# Patient Record
Sex: Male | Born: 1935 | Race: White | Hispanic: No | Marital: Married | Smoking: Never smoker
Health system: Southern US, Community
[De-identification: ages and names within clinical notes are randomized; demographics above are authoritative.]

## PROBLEM LIST (undated history)

## (undated) DIAGNOSIS — F0390 Unspecified dementia without behavioral disturbance: Secondary | ICD-10-CM

## (undated) DIAGNOSIS — F0280 Dementia in other diseases classified elsewhere without behavioral disturbance: Secondary | ICD-10-CM

## (undated) DIAGNOSIS — I10 Essential (primary) hypertension: Secondary | ICD-10-CM

## (undated) DIAGNOSIS — G309 Alzheimer's disease, unspecified: Secondary | ICD-10-CM

---

## 2013-11-28 ENCOUNTER — Emergency Department (HOSPITAL_COMMUNITY): Payer: Medicare Other

## 2013-11-28 ENCOUNTER — Inpatient Hospital Stay (HOSPITAL_COMMUNITY)
Admission: EM | Admit: 2013-11-28 | Discharge: 2013-12-01 | DRG: 482 | Disposition: A | Discharge status: Skilled Nursing Facility | Payer: Medicare Other | Attending: Internal Medicine | Admitting: Internal Medicine

## 2013-11-28 ENCOUNTER — Encounter (HOSPITAL_COMMUNITY): Payer: Self-pay | Admitting: Emergency Medicine

## 2013-11-28 DIAGNOSIS — E785 Hyperlipidemia, unspecified: Secondary | ICD-10-CM | POA: Diagnosis present

## 2013-11-28 DIAGNOSIS — S72142A Displaced intertrochanteric fracture of left femur, initial encounter for closed fracture: Secondary | ICD-10-CM

## 2013-11-28 DIAGNOSIS — F32A Depression, unspecified: Secondary | ICD-10-CM | POA: Diagnosis present

## 2013-11-28 DIAGNOSIS — R9431 Abnormal electrocardiogram [ECG] [EKG]: Secondary | ICD-10-CM | POA: Diagnosis present

## 2013-11-28 DIAGNOSIS — S72143A Displaced intertrochanteric fracture of unspecified femur, initial encounter for closed fracture: Principal | ICD-10-CM | POA: Diagnosis present

## 2013-11-28 DIAGNOSIS — F3289 Other specified depressive episodes: Secondary | ICD-10-CM | POA: Diagnosis present

## 2013-11-28 DIAGNOSIS — Z66 Do not resuscitate: Secondary | ICD-10-CM | POA: Diagnosis not present

## 2013-11-28 DIAGNOSIS — F411 Generalized anxiety disorder: Secondary | ICD-10-CM | POA: Diagnosis present

## 2013-11-28 DIAGNOSIS — Z79899 Other long term (current) drug therapy: Secondary | ICD-10-CM

## 2013-11-28 DIAGNOSIS — Z7982 Long term (current) use of aspirin: Secondary | ICD-10-CM

## 2013-11-28 DIAGNOSIS — S72002A Fracture of unspecified part of neck of left femur, initial encounter for closed fracture: Secondary | ICD-10-CM | POA: Diagnosis present

## 2013-11-28 DIAGNOSIS — W19XXXA Unspecified fall, initial encounter: Secondary | ICD-10-CM | POA: Diagnosis present

## 2013-11-28 DIAGNOSIS — I10 Essential (primary) hypertension: Secondary | ICD-10-CM | POA: Diagnosis present

## 2013-11-28 DIAGNOSIS — F0280 Dementia in other diseases classified elsewhere without behavioral disturbance: Secondary | ICD-10-CM | POA: Diagnosis present

## 2013-11-28 DIAGNOSIS — G309 Alzheimer's disease, unspecified: Secondary | ICD-10-CM | POA: Diagnosis present

## 2013-11-28 DIAGNOSIS — Y9210 Unspecified residential institution as the place of occurrence of the external cause: Secondary | ICD-10-CM | POA: Diagnosis present

## 2013-11-28 DIAGNOSIS — F329 Major depressive disorder, single episode, unspecified: Secondary | ICD-10-CM

## 2013-11-28 DIAGNOSIS — S72009A Fracture of unspecified part of neck of unspecified femur, initial encounter for closed fracture: Secondary | ICD-10-CM

## 2013-11-28 DIAGNOSIS — N400 Enlarged prostate without lower urinary tract symptoms: Secondary | ICD-10-CM | POA: Diagnosis present

## 2013-11-28 DIAGNOSIS — F419 Anxiety disorder, unspecified: Secondary | ICD-10-CM | POA: Diagnosis present

## 2013-11-28 HISTORY — DX: Unspecified dementia, unspecified severity, without behavioral disturbance, psychotic disturbance, mood disturbance, and anxiety: F03.90

## 2013-11-28 HISTORY — DX: Essential (primary) hypertension: I10

## 2013-11-28 HISTORY — DX: Alzheimer's disease, unspecified: G30.9

## 2013-11-28 HISTORY — DX: Dementia in other diseases classified elsewhere, unspecified severity, without behavioral disturbance, psychotic disturbance, mood disturbance, and anxiety: F02.80

## 2013-11-28 LAB — CBC WITH DIFFERENTIAL/PLATELET
BASOS ABS: 0 10*3/uL (ref 0.0–0.1)
BASOS PCT: 0 % (ref 0–1)
EOS ABS: 0 10*3/uL (ref 0.0–0.7)
Eosinophils Relative: 0 % (ref 0–5)
HCT: 35.5 % — ABNORMAL LOW (ref 39.0–52.0)
Hemoglobin: 12.1 g/dL — ABNORMAL LOW (ref 13.0–17.0)
Lymphocytes Relative: 8 % — ABNORMAL LOW (ref 12–46)
Lymphs Abs: 0.6 10*3/uL — ABNORMAL LOW (ref 0.7–4.0)
MCH: 31.2 pg (ref 26.0–34.0)
MCHC: 34.1 g/dL (ref 30.0–36.0)
MCV: 91.5 fL (ref 78.0–100.0)
Monocytes Absolute: 0.7 10*3/uL (ref 0.1–1.0)
Monocytes Relative: 9 % (ref 3–12)
NEUTROS PCT: 83 % — AB (ref 43–77)
Neutro Abs: 6.1 10*3/uL (ref 1.7–7.7)
Platelets: 178 10*3/uL (ref 150–400)
RBC: 3.88 MIL/uL — ABNORMAL LOW (ref 4.22–5.81)
RDW: 12.5 % (ref 11.5–15.5)
WBC: 7.4 10*3/uL (ref 4.0–10.5)

## 2013-11-28 LAB — URINALYSIS W MICROSCOPIC + REFLEX CULTURE
Bilirubin Urine: NEGATIVE
Glucose, UA: NEGATIVE mg/dL
Hgb urine dipstick: NEGATIVE
KETONES UR: NEGATIVE mg/dL
LEUKOCYTES UA: NEGATIVE
NITRITE: NEGATIVE
PH: 8 (ref 5.0–8.0)
PROTEIN: NEGATIVE mg/dL
Specific Gravity, Urine: 1.022 (ref 1.005–1.030)
Urobilinogen, UA: 1 mg/dL (ref 0.0–1.0)

## 2013-11-28 LAB — BASIC METABOLIC PANEL
BUN: 10 mg/dL (ref 6–23)
CHLORIDE: 102 meq/L (ref 96–112)
CO2: 26 mEq/L (ref 19–32)
CREATININE: 0.68 mg/dL (ref 0.50–1.35)
Calcium: 8.6 mg/dL (ref 8.4–10.5)
Glucose, Bld: 139 mg/dL — ABNORMAL HIGH (ref 70–99)
POTASSIUM: 3.7 meq/L (ref 3.7–5.3)
Sodium: 138 mEq/L (ref 137–147)

## 2013-11-28 MED ORDER — LACTATED RINGERS IV SOLN: INTRAVENOUS | Status: DC

## 2013-11-28 MED ORDER — ACETAMINOPHEN 325 MG PO TABS
650.0000 mg | ORAL_TABLET | Freq: Four times a day (QID) | ORAL | Status: DC | PRN
Start: 2013-11-28 — End: 2013-11-29

## 2013-11-28 MED ORDER — HYDROMORPHONE HCL PF 1 MG/ML IJ SOLN
0.5000 mg | Freq: Once | INTRAMUSCULAR | Status: AC
  Administered 2013-11-28: 0.5 mg via INTRAMUSCULAR

## 2013-11-28 MED ORDER — ACETAMINOPHEN 650 MG RE SUPP
650.0000 mg | Freq: Four times a day (QID) | RECTAL | Status: DC | PRN
Start: 2013-11-28 — End: 2013-11-29

## 2013-11-28 MED ORDER — SODIUM CHLORIDE 0.9 % IV SOLN: INTRAVENOUS | Status: DC

## 2013-11-28 MED ORDER — LORAZEPAM 2 MG/ML IJ SOLN
0.5000 mg | Freq: Once | INTRAMUSCULAR | Status: AC
  Administered 2013-11-28: 0.5 mg via INTRAMUSCULAR
  Filled 2013-11-28: qty 1

## 2013-11-28 MED ORDER — ONDANSETRON HCL 4 MG/2ML IJ SOLN
4.0000 mg | Freq: Four times a day (QID) | INTRAMUSCULAR | Status: DC | PRN
Start: 2013-11-28 — End: 2013-11-29

## 2013-11-28 MED ORDER — CITALOPRAM HYDROBROMIDE 10 MG PO TABS
10.0000 mg | ORAL_TABLET | Freq: Every day | ORAL | Status: DC
  Administered 2013-11-29 – 2013-12-01 (×3): 10 mg via ORAL
  Filled 2013-11-28 (×3): qty 1

## 2013-11-28 MED ORDER — DEXTROMETHORPHAN-QUINIDINE 20-10 MG PO CAPS: 1.0000 | ORAL_CAPSULE | Freq: Two times a day (BID) | ORAL | Status: DC

## 2013-11-28 MED ORDER — MORPHINE SULFATE 2 MG/ML IJ SOLN: 1.0000 mg | INTRAMUSCULAR | Status: DC | PRN

## 2013-11-28 MED ORDER — QUETIAPINE FUMARATE 25 MG PO TABS
25.0000 mg | ORAL_TABLET | Freq: Every day | ORAL | Status: DC
  Administered 2013-11-29 – 2013-12-01 (×3): 25 mg via ORAL
  Filled 2013-11-28 (×3): qty 1

## 2013-11-28 MED ORDER — SIMVASTATIN 10 MG PO TABS
10.0000 mg | ORAL_TABLET | Freq: Every day | ORAL | Status: DC
  Administered 2013-11-29 – 2013-11-30 (×2): 10 mg via ORAL
  Filled 2013-11-28 (×3): qty 1

## 2013-11-28 MED ORDER — ACETAMINOPHEN 325 MG PO TABS
650.0000 mg | ORAL_TABLET | Freq: Once | ORAL | Status: DC
  Filled 2013-11-28: qty 2

## 2013-11-28 MED ORDER — ONDANSETRON HCL 4 MG PO TABS: 4.0000 mg | ORAL_TABLET | Freq: Four times a day (QID) | ORAL | Status: DC | PRN

## 2013-11-28 MED ORDER — DOCUSATE SODIUM 100 MG PO CAPS
100.0000 mg | ORAL_CAPSULE | Freq: Two times a day (BID) | ORAL | Status: DC
  Administered 2013-11-29 – 2013-11-30 (×3): 100 mg via ORAL
  Filled 2013-11-28 (×6): qty 1

## 2013-11-28 MED ORDER — FINASTERIDE 5 MG PO TABS
5.0000 mg | ORAL_TABLET | Freq: Every day | ORAL | Status: DC
  Administered 2013-11-29 – 2013-12-01 (×3): 5 mg via ORAL
  Filled 2013-11-28 (×3): qty 1

## 2013-11-28 MED ORDER — PANTOPRAZOLE SODIUM 40 MG PO TBEC
40.0000 mg | DELAYED_RELEASE_TABLET | Freq: Every day | ORAL | Status: DC
  Administered 2013-11-29 – 2013-12-01 (×3): 40 mg via ORAL
  Filled 2013-11-28 (×3): qty 1

## 2013-11-28 MED ORDER — DOXAZOSIN MESYLATE 4 MG PO TABS
4.0000 mg | ORAL_TABLET | Freq: Every day | ORAL | Status: DC
  Administered 2013-11-29 – 2013-11-30 (×2): 4 mg via ORAL
  Filled 2013-11-28 (×3): qty 1

## 2013-11-28 MED ORDER — SENNA 8.6 MG PO TABS
1.0000 | ORAL_TABLET | Freq: Two times a day (BID) | ORAL | Status: DC
  Administered 2013-11-29 – 2013-12-01 (×5): 8.6 mg via ORAL
  Filled 2013-11-28 (×5): qty 1

## 2013-11-28 MED ORDER — QUETIAPINE FUMARATE 50 MG PO TABS
50.0000 mg | ORAL_TABLET | Freq: Every day | ORAL | Status: DC
  Administered 2013-11-29 – 2013-11-30 (×2): 50 mg via ORAL
  Filled 2013-11-28 (×3): qty 1

## 2013-11-28 MED ORDER — HYDROMORPHONE HCL PF 1 MG/ML IJ SOLN
0.5000 mg | Freq: Once | INTRAMUSCULAR | Status: DC
  Filled 2013-11-28: qty 1

## 2013-11-28 MED ORDER — LORAZEPAM 0.5 MG PO TABS
0.5000 mg | ORAL_TABLET | Freq: Once | ORAL | Status: AC
Start: 2013-11-28 — End: 2013-11-28
  Administered 2013-11-28: 0.5 mg via ORAL
  Filled 2013-11-28: qty 1

## 2013-11-28 MED ORDER — LORAZEPAM 0.5 MG PO TABS
0.5000 mg | ORAL_TABLET | Freq: Two times a day (BID) | ORAL | Status: DC
  Administered 2013-11-29 – 2013-11-30 (×3): 0.5 mg via ORAL
  Filled 2013-11-28 (×3): qty 1

## 2013-11-28 NOTE — Progress Notes (Signed)
   CARE MANAGEMENT ED NOTE 11/28/2013  Patient:  [name] [name],[name] [name]   Account Number:  [id number]  Date Initiated:  11/28/2013  Documentation initiated by:  Radford PaxFERRERO,Melven Stockard  Subjective/Objective Assessment:   Patient presents to Ed post unwitnessed fall, unwilling to bear weight to left lower extrmity.     Subjective/Objective Assessment Detail:   Patient with pmhx of alzheimer's anf HTN.     Action/Plan:   Action/Plan Detail:   Anticipated DC Date:       Status Recommendation to Physician:   Result of Recommendation:    Other ED Services  Consult Working Plan    DC Planning Services  OP Neuro Rehab  PCP issues    Choice offered to / List presented to:            Status of service:  Completed, signed off  ED Comments:   ED Comments Detail:  EDCM spoke to patient and his wife at bedside. Patient is a new resident at the [facility] [place].  Patient's wife reports patient's pcp is the physician at the nursing facility.  EDCM spoke to med tech Pansy at [facility] who reports the patient's pcp may be Dr. Sonia BallerMazzochi or Dr. Sherri Radrip. System updated.

## 2013-11-28 NOTE — ED Notes (Signed)
Pts agitation is increasing again. Wife has asked if we could medicate the patient before starting IV. Dr Romeo AppleHarrison to put in orders.

## 2013-11-28 NOTE — ED Notes (Signed)
Just spoke w Dr. August Saucerean who asked that we keep pt in the ED as pt will be going to surgery sooner than expected. Spoke w/ Talmadge Chadonna Alcorta pt's wife to obtain consent via telephone.  Mrs. Chaudoin was able to verbalize procedure and give consent to this RN as well as Tobi BastosAnna, Charity fundraiserN.

## 2013-11-28 NOTE — H&P (Signed)
[facility] History and Physical  [name] [name] ZOX:[id number] DOB: 1935-10-14 DOA: 11/28/2013  Referring physician:  PCP: Florentina Jenny, MD  Specialists:   Chief Complaint: Left hip pain  HPI: [name] [name] is a 79 y.o. WM PMHx depression, dementia, Alzheimer disease, anxiety, HLD, BPH Presented from his nursing facility with an unwitnessed fall which occurred earlier today. The patient was found down. The facility he notes that he had some bruising on his left forearm and was unable to put weight on his left leg. The wife is at the bedside and states that the patient has otherwise been well and is at baseline. They recently moved to the area from [place]. He is afebrile, mildly hypertensive, vital signs otherwise unremarkable. He has a history of dementia and is mildly agitated on exam which the wife states is normal. Will give Ativan and get screening imaging. The patient is difficult to examine due to agitation and confused responses to questioning. Will give tylenol, ativan, and get screening imaging. Per Dr. Purvis Sheffield (ED) have spoken with Dr. Dorene Grebe (orthopedic surgeon) who plans on taking patient to surgery tonight.   Review of Systems: The patient denies anorexia, fever, weight loss,, vision loss, decreased hearing, hoarseness, chest pain, syncope, dyspnea on exertion, peripheral edema, balance deficits, hemoptysis, abdominal pain, melena, hematochezia, severe indigestion/heartburn, hematuria, incontinence, genital sores, muscle weakness, suspicious skin lesions, transient blindness, difficulty walking, depression, unusual weight change, abnormal bleeding, enlarged lymph nodes, angioedema, and breast masses.    TRAVEL HISTORY:   Procedure DG hip left 11/28/2013 Comminuted left intertrochanteric fracture with varus angulation.  There is also posterior angulation and 1/2 shaft width of anterior  displacement of the distal fragment.  CT head without contrast/CT  C-spine without contrast 11/28/2013  CT head: Extensive atrophy with small vessel disease throughout the  centra semiovale bilaterally. There is no intracranial mass,  hemorrhage, or acute appearing infarct. There is probable cerumen in  both external auditory canals.  CT cervical spine: Multilevel osteoarthritic change. Slight  spondylolisthesis is C3-4 is felt to be due to spondylosis. No  fracture apparent.  There is a focal nodular lesion in the left lobe of the thyroid.  Nonemergent thyroid ultrasound advised to further assess.    Antibiotics   Consultation Dr. Dorene Grebe (orthopedic surgeon)    Past Medical History  Diagnosis Date  . Dementia   . Hypertension   . Alzheimer disease    History reviewed. No pertinent past surgical history. Social History:  reports that he has never smoked. He does not have any smokeless tobacco history on file. He reports that he does not drink alcohol or use illicit drugs. where does patient live--home, ALF, SNF? Lives at nursing home  Can patient participate in ADLs? No  No Known Allergies  No family history on file.   Prior to Admission medications   Medication Sig Start Date End Date Taking? Authorizing Provider  aspirin 81 MG tablet Take 81 mg by mouth daily.   Yes Historical Provider, MD  citalopram (CELEXA) 10 MG tablet Take 10 mg by mouth daily.   Yes Historical Provider, MD  Dextromethorphan-Quinidine (NUEDEXTA) 20-10 MG CAPS Take 1 capsule by mouth 2 (two) times daily.   Yes Historical Provider, MD  doxazosin (CARDURA) 4 MG tablet Take 4 mg by mouth at bedtime.   Yes Historical Provider, MD  finasteride (PROSCAR) 5 MG tablet Take 5 mg by mouth daily.   Yes Historical Provider, MD  LORazepam (ATIVAN) 0.5 MG tablet Take 0.5  mg by mouth 2 (two) times daily.   Yes Historical Provider, MD  metroNIDAZOLE (METROGEL) 0.75 % gel Apply 1 application topically 2 (two) times daily. Apply to rash on face   Yes Historical Provider, MD   omeprazole (PRILOSEC) 20 MG capsule Take 20 mg by mouth daily.   Yes Historical Provider, MD  pravastatin (PRAVACHOL) 20 MG tablet Take 20 mg by mouth at bedtime.   Yes Historical Provider, MD  QUEtiapine (SEROQUEL) 25 MG tablet Take 25 mg by mouth daily.   Yes Historical Provider, MD  QUEtiapine (SEROQUEL) 50 MG tablet Take 50 mg by mouth at bedtime.   Yes Historical Provider, MD   Physical Exam: Filed Vitals:   11/28/13 1430 11/28/13 1810 11/28/13 2317  BP: 162/108 140/83 127/70  Pulse: 98 98 86  Temp: 97.9 F (36.6 C)    TempSrc: Oral    Resp: 20 17 16   SpO2: 96% 100% 93%     General: A./O. x0, NAD (recently had dose of Dilaudid)  Eyes: Pupils equal round reactive to light and accommodation  Neck: Negative JVD, negative lymphadenopathy  Cardiovascular: Regular rhythm and rate, negative murmurs rubs or gallops, DP/PT pulse plus one bilateral   Respiratory: Clear to hospitalization bilateral  Abdomen: Soft, nontender, nondistended, plus bowel side  Skin: Negative lesions, rash  Musculoskeletal: Left leg rotated outward  Neurologic: Unable to assess secondary to patient's mental status  Labs on Admission:  Basic Metabolic Panel:  Recent Labs Lab 11/28/13 2055  NA 138  K 3.7  CL 102  CO2 26  GLUCOSE 139*  BUN 10  CREATININE 0.68  CALCIUM 8.6   Liver Function Tests: No results found for this basename: AST, ALT, ALKPHOS, BILITOT, PROT, ALBUMIN,  in the last 168 hours No results found for this basename: LIPASE, AMYLASE,  in the last 168 hours No results found for this basename: AMMONIA,  in the last 168 hours CBC:  Recent Labs Lab 11/28/13 2055  WBC 7.4  NEUTROABS 6.1  HGB 12.1*  HCT 35.5*  MCV 91.5  PLT 178   Cardiac Enzymes: No results found for this basename: CKTOTAL, CKMB, CKMBINDEX, TROPONINI,  in the last 168 hours  BNP (last 3 results) No results found for this basename: PROBNP,  in the last 8760 hours CBG: No results found for this  basename: GLUCAP,  in the last 168 hours  Radiological Exams on Admission: Dg Chest 2 View  11/28/2013   CLINICAL DATA:  Larey SeatFell.  History of hypertension.  EXAM: CHEST  2 VIEW  COMPARISON:  None.  FINDINGS: Poor inspiration. Mildly enlarged cardiac silhouette. Clear lungs. Thoracic spine degenerative changes. No fracture or pneumothorax seen.  IMPRESSION: No acute abnormality.   Electronically Signed   By: Gordan PaymentSteve  Reid M.D.   On: 11/28/2013 19:39   Dg Forearm Left  11/28/2013   CLINICAL DATA:  Left forearm pain following injury  EXAM: LEFT FOREARM - 2 VIEW  COMPARISON:  None.  FINDINGS: There is no evidence of fracture or other focal bone lesions. Soft tissues are unremarkable.  IMPRESSION: No acute abnormality noted.   Electronically Signed   By: Alcide CleverMark  Lukens M.D.   On: 11/28/2013 19:39   Dg Hip Complete Left  11/28/2013   CLINICAL DATA:  Left hip pain following a fall.  EXAM: LEFT HIP - COMPLETE 2+ VIEW  COMPARISON:  None.  FINDINGS: Comminuted left intertrochanteric fracture with varus angulation. There is also posterior angulation and 1/2 shaft width of anterior displacement of the distal  fragment.  IMPRESSION: Comminuted left intertrochanteric fracture, as described above.   Electronically Signed   By: Gordan Payment M.D.   On: 11/28/2013 19:40   Ct Head Wo Contrast  11/28/2013   CLINICAL DATA:  Pain post trauma  EXAM: CT HEAD WITHOUT CONTRAST  CT CERVICAL SPINE WITHOUT CONTRAST  TECHNIQUE: Multidetector CT imaging of the head and cervical spine was performed following the standard protocol without intravenous contrast. Multiplanar CT image reconstructions of the cervical spine were also generated.  COMPARISON:  None.  FINDINGS: CT HEAD FINDINGS  There is moderately severe diffuse atrophy. There is no mass, hemorrhage, extra-axial fluid collection, or midline shift. There is small vessel disease throughout the centra semiovale bilaterally. No acute appearing infarct is seen, however.  Bony calvarium appears  intact. The mastoid air cells are clear. There is debris in both external auditory canals.  CT CERVICAL SPINE FINDINGS  There is no fracture. There is slight anterolisthesis of C3 on C4, felt to be due to spondylosis. There is no other appreciable spondylolisthesis. Prevertebral soft tissues and predental space regions are normal.  There is disc space narrowing, marked, at C4-5, C5-6, and C6-7. There is moderate disc space narrowing at C3-4 and C7-T1. There is slightly milder narrowing at C2-3. There is moderate facet hypertrophy at all levels bilaterally. There is no frank disc extrusion or stenosis appreciable.  There is a nodular lesion in the left lobe of the thyroid measuring 1.1 x 0.8 cm.  IMPRESSION: CT head: Extensive atrophy with small vessel disease throughout the centra semiovale bilaterally. There is no intracranial mass, hemorrhage, or acute appearing infarct. There is probable cerumen in both external auditory canals.  CT cervical spine: Multilevel osteoarthritic change. Slight spondylolisthesis is C3-4 is felt to be due to spondylosis. No fracture apparent.  There is a focal nodular lesion in the left lobe of the thyroid. Nonemergent thyroid ultrasound advised to further assess.   Electronically Signed   By: Bretta Bang M.D.   On: 11/28/2013 15:26   Ct Cervical Spine Wo Contrast  11/28/2013   CLINICAL DATA:  Pain post trauma  EXAM: CT HEAD WITHOUT CONTRAST  CT CERVICAL SPINE WITHOUT CONTRAST  TECHNIQUE: Multidetector CT imaging of the head and cervical spine was performed following the standard protocol without intravenous contrast. Multiplanar CT image reconstructions of the cervical spine were also generated.  COMPARISON:  None.  FINDINGS: CT HEAD FINDINGS  There is moderately severe diffuse atrophy. There is no mass, hemorrhage, extra-axial fluid collection, or midline shift. There is small vessel disease throughout the centra semiovale bilaterally. No acute appearing infarct is seen,  however.  Bony calvarium appears intact. The mastoid air cells are clear. There is debris in both external auditory canals.  CT CERVICAL SPINE FINDINGS  There is no fracture. There is slight anterolisthesis of C3 on C4, felt to be due to spondylosis. There is no other appreciable spondylolisthesis. Prevertebral soft tissues and predental space regions are normal.  There is disc space narrowing, marked, at C4-5, C5-6, and C6-7. There is moderate disc space narrowing at C3-4 and C7-T1. There is slightly milder narrowing at C2-3. There is moderate facet hypertrophy at all levels bilaterally. There is no frank disc extrusion or stenosis appreciable.  There is a nodular lesion in the left lobe of the thyroid measuring 1.1 x 0.8 cm.  IMPRESSION: CT head: Extensive atrophy with small vessel disease throughout the centra semiovale bilaterally. There is no intracranial mass, hemorrhage, or acute appearing infarct. There is  probable cerumen in both external auditory canals.  CT cervical spine: Multilevel osteoarthritic change. Slight spondylolisthesis is C3-4 is felt to be due to spondylosis. No fracture apparent.  There is a focal nodular lesion in the left lobe of the thyroid. Nonemergent thyroid ultrasound advised to further assess.   Electronically Signed   By: Bretta Bang M.D.   On: 11/28/2013 15:26    EKG: NSR, nonspecific ST-T wave changes in leads III, aVF, cannot rule out ischemia. No previous EKG for comparison  Assessment/Plan Active Problems:   Closed left hip fracture   Alzheimer's dementia   Depression   Anxiety   HLD (hyperlipidemia)   BPH (benign prostatic hyperplasia)   Abnormal EKG    Comminuted left intertrochanteric fracture -Dr. Dorene Grebe (orthopedic surgeon) to take patient to the OR this evening  Anxiety -Continue Ativan  Depression -Continue Celexa -Continue Seroquel  BPH -Proscar and Cardura   HLD -Continue  HTN -See BPH  Abnormal EKG -Obtain troponin  x3 -Obtain proBNP -Obtain TSH    Code Status: Full Family Communication: No family members present Disposition Plan: Per orthopedic surgery  Time spent: 45 minutes Drema [place] [facility] Pager [phone]  If 7PM-7AM, please contact night-coverage [email or web address] Password [facility] 11/28/2013, 11:27 PM

## 2013-11-28 NOTE — Consult Note (Signed)
Reason for Consult: Left hip pain Referring Physician: dr Sherral Hammers  [name] [name] is an 79 y.o. male.  HPI: [name] [name] is a 79 year old patient who sustained an unwitnessed fall today. He reports left hip pain. Patient has dementia. He was just admitted here 2 days ago from [place] to stay with her daughter. He has reported decreased mobility and potential onset of early Parkinson's. Nonetheless the patient is somewhat level for wheelchair transfers. He denies any other orthopedic complaints. No history of DVT or pulmonary embolism  Past Medical History  Diagnosis Date  . Dementia   . Hypertension   . Alzheimer disease     History reviewed. No pertinent past surgical history.  No family history on file.  Social History:  reports that he has never smoked. He does not have any smokeless tobacco history on file. He reports that he does not drink alcohol or use illicit drugs.  Allergies: No Known Allergies  Medications: I have reviewed the patient's current medications.  Results for orders placed during the hospital encounter of 11/28/13 (from the past 48 hour(s))  URINALYSIS W MICROSCOPIC + REFLEX CULTURE     Status: Abnormal   Collection Time    11/28/13  6:44 PM      Result Value Range   Color, Urine YELLOW  YELLOW   APPearance CLOUDY (*) CLEAR   Specific Gravity, Urine 1.022  1.005 - 1.030   pH 8.0  5.0 - 8.0   Glucose, UA NEGATIVE  NEGATIVE mg/dL   Hgb urine dipstick NEGATIVE  NEGATIVE   Bilirubin Urine NEGATIVE  NEGATIVE   Ketones, ur NEGATIVE  NEGATIVE mg/dL   Protein, ur NEGATIVE  NEGATIVE mg/dL   Urobilinogen, UA 1.0  0.0 - 1.0 mg/dL   Nitrite NEGATIVE  NEGATIVE   Leukocytes, UA NEGATIVE  NEGATIVE   WBC, UA 3-6  <3 WBC/hpf   RBC / HPF 0-2  <3 RBC/hpf   Bacteria, UA FEW (*) RARE   Squamous Epithelial / LPF RARE  RARE   Urine-Other AMORPHOUS URATES/PHOSPHATES    CBC WITH DIFFERENTIAL     Status: Abnormal   Collection Time    11/28/13  8:55 PM      Result Value Range    WBC 7.4  4.0 - 10.5 K/uL   RBC 3.88 (*) 4.22 - 5.81 MIL/uL   Hemoglobin 12.1 (*) 13.0 - 17.0 g/dL   HCT 35.5 (*) 39.0 - 52.0 %   MCV 91.5  78.0 - 100.0 fL   MCH 31.2  26.0 - 34.0 pg   MCHC 34.1  30.0 - 36.0 g/dL   RDW 12.5  11.5 - 15.5 %   Platelets 178  150 - 400 K/uL   Neutrophils Relative % 83 (*) 43 - 77 %   Neutro Abs 6.1  1.7 - 7.7 K/uL   Lymphocytes Relative 8 (*) 12 - 46 %   Lymphs Abs 0.6 (*) 0.7 - 4.0 K/uL   Monocytes Relative 9  3 - 12 %   Monocytes Absolute 0.7  0.1 - 1.0 K/uL   Eosinophils Relative 0  0 - 5 %   Eosinophils Absolute 0.0  0.0 - 0.7 K/uL   Basophils Relative 0  0 - 1 %   Basophils Absolute 0.0  0.0 - 0.1 K/uL  BASIC METABOLIC PANEL     Status: Abnormal   Collection Time    11/28/13  8:55 PM      Result Value Range   Sodium 138  137 - 147 mEq/L  Potassium 3.7  3.7 - 5.3 mEq/L   Chloride 102  96 - 112 mEq/L   CO2 26  19 - 32 mEq/L   Glucose, Bld 139 (*) 70 - 99 mg/dL   BUN 10  6 - 23 mg/dL   Creatinine, Ser 0.68  0.50 - 1.35 mg/dL   Calcium 8.6  8.4 - 10.5 mg/dL   GFR calc non Af Amer >90  >90 mL/min   GFR calc Af Amer >90  >90 mL/min   Comment: (NOTE)     The eGFR has been calculated using the CKD EPI equation.     This calculation has not been validated in all clinical situations.     eGFR's persistently <90 mL/min signify possible Chronic Kidney     Disease.    Dg Chest 2 View  11/28/2013   CLINICAL DATA:  [place].  History of hypertension.  EXAM: CHEST  2 VIEW  COMPARISON:  None.  FINDINGS: Poor inspiration. Mildly enlarged cardiac silhouette. Clear lungs. Thoracic spine degenerative changes. No fracture or pneumothorax seen.  IMPRESSION: No acute abnormality.   Electronically Signed   By: Enrique Sack M.D.   On: 11/28/2013 19:39   Dg Forearm Left  11/28/2013   CLINICAL DATA:  Left forearm pain following injury  EXAM: LEFT FOREARM - 2 VIEW  COMPARISON:  None.  FINDINGS: There is no evidence of fracture or other focal bone lesions. Soft tissues are  unremarkable.  IMPRESSION: No acute abnormality noted.   Electronically Signed   By: Inez Catalina M.D.   On: 11/28/2013 19:39   Dg Hip Complete Left  11/28/2013   CLINICAL DATA:  Left hip pain following a fall.  EXAM: LEFT HIP - COMPLETE 2+ VIEW  COMPARISON:  None.  FINDINGS: Comminuted left intertrochanteric fracture with varus angulation. There is also posterior angulation and 1/2 shaft width of anterior displacement of the distal fragment.  IMPRESSION: Comminuted left intertrochanteric fracture, as described above.   Electronically Signed   By: Enrique Sack M.D.   On: 11/28/2013 19:40   Ct Head Wo Contrast  11/28/2013   CLINICAL DATA:  Pain post trauma  EXAM: CT HEAD WITHOUT CONTRAST  CT CERVICAL SPINE WITHOUT CONTRAST  TECHNIQUE: Multidetector CT imaging of the head and cervical spine was performed following the standard protocol without intravenous contrast. Multiplanar CT image reconstructions of the cervical spine were also generated.  COMPARISON:  None.  FINDINGS: CT HEAD FINDINGS  There is moderately severe diffuse atrophy. There is no mass, hemorrhage, extra-axial fluid collection, or midline shift. There is small vessel disease throughout the centra semiovale bilaterally. No acute appearing infarct is seen, however.  Bony calvarium appears intact. The mastoid air cells are clear. There is debris in both external auditory canals.  CT CERVICAL SPINE FINDINGS  There is no fracture. There is slight anterolisthesis of C3 on C4, felt to be due to spondylosis. There is no other appreciable spondylolisthesis. Prevertebral soft tissues and predental space regions are normal.  There is disc space narrowing, marked, at C4-5, C5-6, and C6-7. There is moderate disc space narrowing at C3-4 and C7-T1. There is slightly milder narrowing at C2-3. There is moderate facet hypertrophy at all levels bilaterally. There is no frank disc extrusion or stenosis appreciable.  There is a nodular lesion in the left lobe of the  thyroid measuring 1.1 x 0.8 cm.  IMPRESSION: CT head: Extensive atrophy with small vessel disease throughout the centra semiovale bilaterally. There is no intracranial mass, hemorrhage, or  acute appearing infarct. There is probable cerumen in both external auditory canals.  CT cervical spine: Multilevel osteoarthritic change. Slight spondylolisthesis is C3-4 is felt to be due to spondylosis. No fracture apparent.  There is a focal nodular lesion in the left lobe of the thyroid. Nonemergent thyroid ultrasound advised to further assess.   Electronically Signed   By: Lowella Grip M.D.   On: 11/28/2013 15:26   Ct Cervical Spine Wo Contrast  11/28/2013   CLINICAL DATA:  Pain post trauma  EXAM: CT HEAD WITHOUT CONTRAST  CT CERVICAL SPINE WITHOUT CONTRAST  TECHNIQUE: Multidetector CT imaging of the head and cervical spine was performed following the standard protocol without intravenous contrast. Multiplanar CT image reconstructions of the cervical spine were also generated.  COMPARISON:  None.  FINDINGS: CT HEAD FINDINGS  There is moderately severe diffuse atrophy. There is no mass, hemorrhage, extra-axial fluid collection, or midline shift. There is small vessel disease throughout the centra semiovale bilaterally. No acute appearing infarct is seen, however.  Bony calvarium appears intact. The mastoid air cells are clear. There is debris in both external auditory canals.  CT CERVICAL SPINE FINDINGS  There is no fracture. There is slight anterolisthesis of C3 on C4, felt to be due to spondylosis. There is no other appreciable spondylolisthesis. Prevertebral soft tissues and predental space regions are normal.  There is disc space narrowing, marked, at C4-5, C5-6, and C6-7. There is moderate disc space narrowing at C3-4 and C7-T1. There is slightly milder narrowing at C2-3. There is moderate facet hypertrophy at all levels bilaterally. There is no frank disc extrusion or stenosis appreciable.  There is a nodular  lesion in the left lobe of the thyroid measuring 1.1 x 0.8 cm.  IMPRESSION: CT head: Extensive atrophy with small vessel disease throughout the centra semiovale bilaterally. There is no intracranial mass, hemorrhage, or acute appearing infarct. There is probable cerumen in both external auditory canals.  CT cervical spine: Multilevel osteoarthritic change. Slight spondylolisthesis is C3-4 is felt to be due to spondylosis. No fracture apparent.  There is a focal nodular lesion in the left lobe of the thyroid. Nonemergent thyroid ultrasound advised to further assess.   Electronically Signed   By: Lowella Grip M.D.   On: 11/28/2013 15:26    Review of Systems  Unable to perform ROS  Blood pressure 140/83, pulse 98, temperature 97.9 F (36.6 C), temperature source Oral, resp. rate 17, SpO2 100.00%. Physical Exam  Constitutional: He appears well-developed.  HENT:  Head: Normocephalic.  Eyes: Pupils are equal, round, and reactive to light.  Neck: Normal range of motion.  Cardiovascular: Normal rate.   Respiratory: Effort normal.  GI: Soft.  Skin: Skin is warm.  Psychiatric: He has a normal mood and affect.   bilateral upper chimney demonstrate reasonable range of motion but some rigidity in the biceps area. Radial pulse intact bilaterally. Shoulder or wrist elbow range of motion is present without crepitus or swelling. Left hip has pain with range of motion no knee effusion or ankle effusion or crepitus on either side. Right hip no pain with range of motion. Patient does have observed ankle dorsiflexion bilaterally.  Assessment/Plan: Impression is left hip intertrochanteric fracture in a minimal ambulator. I discussed his care with the daughter and wife it telephone. Risk and benefits of surgical intervention were discussed as well as nonoperative intervention. In general because he does do transfers and can ambulate some fixation is indicated. Risks including not limited to infection nerve  vessel damage nonhealing nonunion need for further surgery all discussed with the patient and family all questions answered we'll proceed tonight to facilitate mobilization tomorrow.  DEAN,GREGORY SCOTT 11/28/2013, 11:17 PM

## 2013-11-28 NOTE — ED Notes (Signed)
Patient transported to X-ray 

## 2013-11-28 NOTE — ED Notes (Signed)
Per EMS-un witnessed fall, [facility]-wife noticed bruise to left upper extremity-patient not willing to put weight on left lower extremity-increased weakness

## 2013-11-28 NOTE — ED Notes (Signed)
Pt placed on O2 monitor.

## 2013-11-28 NOTE — ED Notes (Signed)
Dr. August Saucerean on phone w/ pt's daughter Baxter HireKristen to discuss surgery that is to take place 11/29/2013.

## 2013-11-28 NOTE — ED Notes (Signed)
Pt was agitated while removing clothes and calling out in pain while grabbing at left side. Pt is less agitated now after a few minutes.

## 2013-11-28 NOTE — ED Provider Notes (Signed)
CSN: [id number]     Arrival date & time 11/28/13  1400 History   First MD Initiated Contact with Patient 11/28/13 1452     Chief Complaint  Patient presents with  . Hip Pain   (Consider location/radiation/quality/duration/timing/severity/associated sxs/prior Treatment) Patient is a 79 y.o. male presenting with hip pain. The history is provided by the spouse and the EMS personnel.  Hip Pain This is a new problem. The current episode started 3 to 5 hours ago. The problem occurs constantly. The problem has not changed since onset.Pertinent negatives include no chest pain, no abdominal pain, no headaches and no shortness of breath. The symptoms are aggravated by walking. Nothing relieves the symptoms. He has tried nothing for the symptoms. The treatment provided no relief.    Past Medical History  Diagnosis Date  . Dementia   . Hypertension   . Alzheimer disease    History reviewed. No pertinent past surgical history. No family history on file. History  Substance Use Topics  . Smoking status: Never Smoker   . Smokeless tobacco: Not on file  . Alcohol Use: No    Review of Systems  Constitutional: Negative for fever.  HENT: Negative for drooling and rhinorrhea.   Eyes: Negative for pain.  Respiratory: Negative for cough and shortness of breath.   Cardiovascular: Negative for chest pain and leg swelling.  Gastrointestinal: Negative for nausea, vomiting, abdominal pain and diarrhea.  Genitourinary: Negative for dysuria and hematuria.  Musculoskeletal: Negative for gait problem and neck pain.  Skin: Negative for color change.  Neurological: Negative for numbness and headaches.  Hematological: Negative for adenopathy.  Psychiatric/Behavioral: Negative for behavioral problems.  All other systems reviewed and are negative.    Allergies  Review of patient's allergies indicates no known allergies.  Home Medications   Current Outpatient Rx  Name  Route  Sig  Dispense  Refill  .  aspirin 81 MG tablet   Oral   Take 81 mg by mouth daily.         . citalopram (CELEXA) 10 MG tablet   Oral   Take 10 mg by mouth daily.         Marland Kitchen Dextromethorphan-Quinidine (NUEDEXTA) 20-10 MG CAPS   Oral   Take 1 capsule by mouth 2 (two) times daily.         Marland Kitchen doxazosin (CARDURA) 4 MG tablet   Oral   Take 4 mg by mouth at bedtime.         . finasteride (PROSCAR) 5 MG tablet   Oral   Take 5 mg by mouth daily.         Marland Kitchen LORazepam (ATIVAN) 0.5 MG tablet   Oral   Take 0.5 mg by mouth 2 (two) times daily.         . metroNIDAZOLE (METROGEL) 0.75 % gel   Topical   Apply 1 application topically 2 (two) times daily. Apply to rash on face         . omeprazole (PRILOSEC) 20 MG capsule   Oral   Take 20 mg by mouth daily.         . pravastatin (PRAVACHOL) 20 MG tablet   Oral   Take 20 mg by mouth at bedtime.         Marland Kitchen QUEtiapine (SEROQUEL) 25 MG tablet   Oral   Take 25 mg by mouth daily.         . QUEtiapine (SEROQUEL) 50 MG tablet   Oral   Take  50 mg by mouth at bedtime.          BP 162/108  Pulse 98  Temp(Src) 97.9 F (36.6 C) (Oral)  Resp 20  SpO2 96% Physical Exam  Nursing note and vitals reviewed. Constitutional: He appears well-developed and well-nourished.  HENT:  Head: Normocephalic and atraumatic.  Right Ear: External ear normal.  Left Ear: External ear normal.  Nose: Nose normal.  Mouth/Throat: Oropharynx is clear and moist. No oropharyngeal exudate.  Tympanic membranes are normal appearing bilaterally.  Eyes: Conjunctivae and EOM are normal. Pupils are equal, round, and reactive to light.  Neck: Normal range of motion. Neck supple.  No obvious focal tenderness to palpation of the vertebra.  Cardiovascular: Normal rate, regular rhythm, normal heart sounds and intact distal pulses.  Exam reveals no gallop and no friction rub.   No murmur heard. Pulmonary/Chest: Effort normal and breath sounds normal. No respiratory distress. He has  no wheezes.  Abdominal: Soft. Bowel sounds are normal. He exhibits no distension. There is no tenderness. There is no rebound and no guarding.  Musculoskeletal: Normal range of motion. He exhibits edema (trace pitting edema in bilateral distal lower extremities.). He exhibits no tenderness.  Mild bruising to distal and lateral aspect of left forearm.  Mild tenderness to palpation of left lateral hip.  Normal passive range of motion of the right hip without pain.  Neurological: He is alert.  A/o x1. Does not know year, location.   Follows most commands. Confused verbal responses by the patient. Mild agitation and aggression.    Skin: Skin is warm and dry.  Psychiatric:  Mild confusion. Mild agitation.     ED Course  Procedures (including critical care time) Labs Review Labs Reviewed  URINALYSIS W MICROSCOPIC + REFLEX CULTURE - Abnormal; Notable for the following:    APPearance CLOUDY (*)    Bacteria, UA FEW (*)    All other components within normal limits  CBC WITH DIFFERENTIAL - Abnormal; Notable for the following:    RBC 3.88 (*)    Hemoglobin 12.1 (*)    HCT 35.5 (*)    Neutrophils Relative % 83 (*)    Lymphocytes Relative 8 (*)    Lymphs Abs 0.6 (*)    All other components within normal limits  BASIC METABOLIC PANEL - Abnormal; Notable for the following:    Glucose, Bld 139 (*)    All other components within normal limits  COMPREHENSIVE METABOLIC PANEL - Abnormal; Notable for the following:    Glucose, Bld 130 (*)    Calcium 8.2 (*)    Total Protein 5.9 (*)    Albumin 3.0 (*)    GFR calc non Af Amer 90 (*)    All other components within normal limits  CBC WITH DIFFERENTIAL - Abnormal; Notable for the following:    RBC 3.57 (*)    Hemoglobin 11.0 (*)    HCT 33.2 (*)    Platelets 148 (*)    All other components within normal limits  MRSA PCR SCREENING  URINE CULTURE  MAGNESIUM  TSH  PRO B NATRIURETIC PEPTIDE  TROPONIN I  TROPONIN I  TROPONIN I  CBC WITH  DIFFERENTIAL   Imaging Review Dg Chest 2 View  11/28/2013   CLINICAL DATA:  Larey SeatFell.  History of hypertension.  EXAM: CHEST  2 VIEW  COMPARISON:  None.  FINDINGS: Poor inspiration. Mildly enlarged cardiac silhouette. Clear lungs. Thoracic spine degenerative changes. No fracture or pneumothorax seen.  IMPRESSION: No acute abnormality.  Electronically Signed   By: Gordan Payment M.D.   On: 11/28/2013 19:39   Dg Forearm Left  11/28/2013   CLINICAL DATA:  Left forearm pain following injury  EXAM: LEFT FOREARM - 2 VIEW  COMPARISON:  None.  FINDINGS: There is no evidence of fracture or other focal bone lesions. Soft tissues are unremarkable.  IMPRESSION: No acute abnormality noted.   Electronically Signed   By: Alcide Clever M.D.   On: 11/28/2013 19:39   Dg Hip Complete Left  11/28/2013   CLINICAL DATA:  Left hip pain following a fall.  EXAM: LEFT HIP - COMPLETE 2+ VIEW  COMPARISON:  None.  FINDINGS: Comminuted left intertrochanteric fracture with varus angulation. There is also posterior angulation and 1/2 shaft width of anterior displacement of the distal fragment.  IMPRESSION: Comminuted left intertrochanteric fracture, as described above.   Electronically Signed   By: Gordan Payment M.D.   On: 11/28/2013 19:40   Dg Femur Left  11/29/2013   CLINICAL DATA:  79 year old male undergoing left proximal femur repair. Initial encounter.  EXAM: LEFT FEMUR - 2 VIEW; DG C-ARM 1-60 MIN - NRPT [facility]  COMPARISON:  Preoperative study 11/28/2013.  FLUOROSCOPY TIME:  1 minute and 4 seconds.  FINDINGS: Five intraoperative fluoroscopic views demonstrate left femur intra medullary rod placement with proximal interlocking dynamic hip screw. Near anatomic alignment about the comminuted intertrochanteric fracture.  IMPRESSION: Left proximal femur ORIF with no adverse features.   Electronically Signed   By: Augusto Gamble M.D.   On: 11/29/2013 03:36   Ct Head Wo Contrast  11/28/2013   CLINICAL DATA:  Pain post trauma  EXAM: CT HEAD WITHOUT  CONTRAST  CT CERVICAL SPINE WITHOUT CONTRAST  TECHNIQUE: Multidetector CT imaging of the head and cervical spine was performed following the standard protocol without intravenous contrast. Multiplanar CT image reconstructions of the cervical spine were also generated.  COMPARISON:  None.  FINDINGS: CT HEAD FINDINGS  There is moderately severe diffuse atrophy. There is no mass, hemorrhage, extra-axial fluid collection, or midline shift. There is small vessel disease throughout the centra semiovale bilaterally. No acute appearing infarct is seen, however.  Bony calvarium appears intact. The mastoid air cells are clear. There is debris in both external auditory canals.  CT CERVICAL SPINE FINDINGS  There is no fracture. There is slight anterolisthesis of C3 on C4, felt to be due to spondylosis. There is no other appreciable spondylolisthesis. Prevertebral soft tissues and predental space regions are normal.  There is disc space narrowing, marked, at C4-5, C5-6, and C6-7. There is moderate disc space narrowing at C3-4 and C7-T1. There is slightly milder narrowing at C2-3. There is moderate facet hypertrophy at all levels bilaterally. There is no frank disc extrusion or stenosis appreciable.  There is a nodular lesion in the left lobe of the thyroid measuring 1.1 x 0.8 cm.  IMPRESSION: CT head: Extensive atrophy with small vessel disease throughout the centra semiovale bilaterally. There is no intracranial mass, hemorrhage, or acute appearing infarct. There is probable cerumen in both external auditory canals.  CT cervical spine: Multilevel osteoarthritic change. Slight spondylolisthesis is C3-4 is felt to be due to spondylosis. No fracture apparent.  There is a focal nodular lesion in the left lobe of the thyroid. Nonemergent thyroid ultrasound advised to further assess.   Electronically Signed   By: Bretta Bang M.D.   On: 11/28/2013 15:26   Ct Cervical Spine Wo Contrast  11/28/2013   CLINICAL DATA:  Pain post  trauma  EXAM: CT HEAD WITHOUT CONTRAST  CT CERVICAL SPINE WITHOUT CONTRAST  TECHNIQUE: Multidetector CT imaging of the head and cervical spine was performed following the standard protocol without intravenous contrast. Multiplanar CT image reconstructions of the cervical spine were also generated.  COMPARISON:  None.  FINDINGS: CT HEAD FINDINGS  There is moderately severe diffuse atrophy. There is no mass, hemorrhage, extra-axial fluid collection, or midline shift. There is small vessel disease throughout the centra semiovale bilaterally. No acute appearing infarct is seen, however.  Bony calvarium appears intact. The mastoid air cells are clear. There is debris in both external auditory canals.  CT CERVICAL SPINE FINDINGS  There is no fracture. There is slight anterolisthesis of C3 on C4, felt to be due to spondylosis. There is no other appreciable spondylolisthesis. Prevertebral soft tissues and predental space regions are normal.  There is disc space narrowing, marked, at C4-5, C5-6, and C6-7. There is moderate disc space narrowing at C3-4 and C7-T1. There is slightly milder narrowing at C2-3. There is moderate facet hypertrophy at all levels bilaterally. There is no frank disc extrusion or stenosis appreciable.  There is a nodular lesion in the left lobe of the thyroid measuring 1.1 x 0.8 cm.  IMPRESSION: CT head: Extensive atrophy with small vessel disease throughout the centra semiovale bilaterally. There is no intracranial mass, hemorrhage, or acute appearing infarct. There is probable cerumen in both external auditory canals.  CT cervical spine: Multilevel osteoarthritic change. Slight spondylolisthesis is C3-4 is felt to be due to spondylosis. No fracture apparent.  There is a focal nodular lesion in the left lobe of the thyroid. Nonemergent thyroid ultrasound advised to further assess.   Electronically Signed   By: Bretta Bang M.D.   On: 11/28/2013 15:26   Dg Pelvis Portable  11/29/2013   CLINICAL  DATA:  79 year old male status post left femoral ORIF. Initial encounter.  EXAM: PORTABLE PELVIS 1-2 VIEWS  COMPARISON:  0323 hr the same day and earlier.  FINDINGS: Portable AP view at 0416 hrs. Partially visible left femur intra medullary rod with proximal interlocking dynamic hip screw. The distal aspect of the IM rod is not included. Near anatomic alignment of the left intertrochanteric fracture. Overlying postoperative changes to the soft tissues. Visible pelvis appears stable.  IMPRESSION: Left femur ORIF with no adverse features.   Electronically Signed   By: Augusto Gamble M.D.   On: 11/29/2013 04:50   Dg C-arm 1-60 Min-no Report  11/29/2013   CLINICAL DATA:  79 year old male undergoing left proximal femur repair. Initial encounter.  EXAM: LEFT FEMUR - 2 VIEW; DG C-ARM 1-60 MIN - NRPT [facility]  COMPARISON:  Preoperative study 11/28/2013.  FLUOROSCOPY TIME:  1 minute and 4 seconds.  FINDINGS: Five intraoperative fluoroscopic views demonstrate left femur intra medullary rod placement with proximal interlocking dynamic hip screw. Near anatomic alignment about the comminuted intertrochanteric fracture.  IMPRESSION: Left proximal femur ORIF with no adverse features.   Electronically Signed   By: Augusto Gamble M.D.   On: 11/29/2013 03:36      MDM   1. Closed left hip fracture   2. Abnormal EKG   3. Alzheimer's dementia   4. Anxiety   5. BPH (benign prostatic hyperplasia)   6. Depression   7. HLD (hyperlipidemia)   8. Hip fracture    3:04 PM 79 y.o. male who presents from his nursing facility with an unwitnessed fall which occurred earlier today. The patient was found down. The facility he notes  that he had some bruising on his left forearm and was unable to put weight on his left leg. The wife is at the bedside and states that the patient has otherwise been well and is at baseline. They recently moved to the area from [place]. He is afebrile, mildly hypertensive, vital signs otherwise unremarkable. He  has a history of dementia and is mildly agitated on exam which the wife states is normal. Will give Ativan and get screening imaging. The patient is difficult to examine due to agitation and confused responses to questioning. Will give tylenol, ativan, and get screening imaging.   Imaging delayed several times d/t agitation.   Found to have left hip fx. Discussed w/ on call ortho. Will admit to hospitalist.     Junius Argyle, MD 11/29/13 1213

## 2013-11-28 NOTE — ED Notes (Signed)
CT's done pt more agitated. Ativan given. And now back to xray. Pt calm at this time.

## 2013-11-29 ENCOUNTER — Inpatient Hospital Stay (HOSPITAL_COMMUNITY): Payer: Medicare Other

## 2013-11-29 ENCOUNTER — Encounter (HOSPITAL_COMMUNITY): Payer: Self-pay | Admitting: Anesthesiology

## 2013-11-29 ENCOUNTER — Encounter (HOSPITAL_COMMUNITY): Payer: Medicare Other | Admitting: Anesthesiology

## 2013-11-29 ENCOUNTER — Encounter (HOSPITAL_COMMUNITY)
Admission: EM | Disposition: A | Discharge status: Skilled Nursing Facility | Payer: Self-pay | Source: Home / Self Care | Attending: Internal Medicine

## 2013-11-29 ENCOUNTER — Inpatient Hospital Stay (HOSPITAL_COMMUNITY): Payer: Medicare Other | Admitting: Anesthesiology

## 2013-11-29 DIAGNOSIS — S72009A Fracture of unspecified part of neck of unspecified femur, initial encounter for closed fracture: Secondary | ICD-10-CM | POA: Diagnosis present

## 2013-11-29 HISTORY — PX: INTRAMEDULLARY (IM) NAIL INTERTROCHANTERIC: SHX5875

## 2013-11-29 LAB — CBC WITH DIFFERENTIAL/PLATELET
BASOS ABS: 0 10*3/uL (ref 0.0–0.1)
BASOS PCT: 0 % (ref 0–1)
Eosinophils Absolute: 0 10*3/uL (ref 0.0–0.7)
Eosinophils Relative: 0 % (ref 0–5)
HCT: 33.2 % — ABNORMAL LOW (ref 39.0–52.0)
Hemoglobin: 11 g/dL — ABNORMAL LOW (ref 13.0–17.0)
LYMPHS PCT: 16 % (ref 12–46)
Lymphs Abs: 1.2 10*3/uL (ref 0.7–4.0)
MCH: 30.8 pg (ref 26.0–34.0)
MCHC: 33.1 g/dL (ref 30.0–36.0)
MCV: 93 fL (ref 78.0–100.0)
Monocytes Absolute: 0.8 10*3/uL (ref 0.1–1.0)
Monocytes Relative: 10 % (ref 3–12)
NEUTROS ABS: 5.9 10*3/uL (ref 1.7–7.7)
Neutrophils Relative %: 74 % (ref 43–77)
Platelets: 148 10*3/uL — ABNORMAL LOW (ref 150–400)
RBC: 3.57 MIL/uL — ABNORMAL LOW (ref 4.22–5.81)
RDW: 12.7 % (ref 11.5–15.5)
WBC: 8 10*3/uL (ref 4.0–10.5)

## 2013-11-29 LAB — URINE CULTURE
COLONY COUNT: NO GROWTH
CULTURE: NO GROWTH

## 2013-11-29 LAB — PRO B NATRIURETIC PEPTIDE: PRO B NATRI PEPTIDE: 281.9 pg/mL (ref 0–450)

## 2013-11-29 LAB — COMPREHENSIVE METABOLIC PANEL
ALT: 15 U/L (ref 0–53)
AST: 23 U/L (ref 0–37)
Albumin: 3 g/dL — ABNORMAL LOW (ref 3.5–5.2)
Alkaline Phosphatase: 79 U/L (ref 39–117)
BUN: 11 mg/dL (ref 6–23)
CALCIUM: 8.2 mg/dL — AB (ref 8.4–10.5)
CO2: 24 mEq/L (ref 19–32)
Chloride: 102 mEq/L (ref 96–112)
Creatinine, Ser: 0.69 mg/dL (ref 0.50–1.35)
GFR calc non Af Amer: 90 mL/min — ABNORMAL LOW (ref >90)
GLUCOSE: 130 mg/dL — AB (ref 70–99)
POTASSIUM: 4.2 meq/L (ref 3.7–5.3)
Sodium: 138 mEq/L (ref 137–147)
TOTAL PROTEIN: 5.9 g/dL — AB (ref 6.0–8.3)
Total Bilirubin: 0.8 mg/dL (ref 0.3–1.2)

## 2013-11-29 LAB — MRSA PCR SCREENING: MRSA by PCR: NEGATIVE

## 2013-11-29 LAB — MAGNESIUM: Magnesium: 2 mg/dL (ref 1.5–2.5)

## 2013-11-29 LAB — TSH: TSH: 2.233 u[IU]/mL (ref 0.350–4.500)

## 2013-11-29 LAB — TROPONIN I
Troponin I: <0.3 ng/mL (ref <0.30)
Troponin I: <0.3 ng/mL (ref <0.30)

## 2013-11-29 SURGERY — FIXATION, FRACTURE, INTERTROCHANTERIC, WITH INTRAMEDULLARY ROD
Anesthesia: Spinal | Site: Hip | Laterality: Left

## 2013-11-29 MED ORDER — LIDOCAINE HCL (CARDIAC) 20 MG/ML IV SOLN
INTRAVENOUS | Status: AC
  Filled 2013-11-29: qty 5

## 2013-11-29 MED ORDER — POTASSIUM CHLORIDE IN NACL 20-0.9 MEQ/L-% IV SOLN
INTRAVENOUS | Status: AC
  Administered 2013-11-29 – 2013-11-30 (×4): via INTRAVENOUS
  Filled 2013-11-29 (×6): qty 1000

## 2013-11-29 MED ORDER — ONDANSETRON HCL 4 MG/2ML IJ SOLN: 4.0000 mg | Freq: Once | INTRAMUSCULAR | Status: DC | PRN

## 2013-11-29 MED ORDER — LORAZEPAM 2 MG/ML IJ SOLN
1.0000 mg | INTRAMUSCULAR | Status: DC | PRN
  Administered 2013-11-29 (×2): 1 mg via INTRAVENOUS
  Filled 2013-11-29 (×3): qty 1

## 2013-11-29 MED ORDER — PHENYLEPHRINE HCL 10 MG/ML IJ SOLN
INTRAMUSCULAR | Status: AC
  Filled 2013-11-29: qty 1

## 2013-11-29 MED ORDER — KETAMINE HCL 10 MG/ML IJ SOLN
INTRAMUSCULAR | Status: AC
  Filled 2013-11-29: qty 1

## 2013-11-29 MED ORDER — SODIUM CHLORIDE 0.9 % IV SOLN
10.0000 mg | INTRAVENOUS | Status: DC | PRN
  Administered 2013-11-29: 25 ug/min via INTRAVENOUS

## 2013-11-29 MED ORDER — PHENOL 1.4 % MT LIQD
1.0000 | OROMUCOSAL | Status: DC | PRN
  Filled 2013-11-29: qty 177

## 2013-11-29 MED ORDER — KETAMINE HCL 10 MG/ML IJ SOLN
INTRAMUSCULAR | Status: DC | PRN
  Administered 2013-11-29: 20 mg via INTRAVENOUS
  Administered 2013-11-29: 50 mg via INTRAVENOUS

## 2013-11-29 MED ORDER — CEFAZOLIN SODIUM-DEXTROSE 2-3 GM-% IV SOLR
INTRAVENOUS | Status: DC | PRN
Start: 2013-11-29 — End: 2013-11-29
  Administered 2013-11-29: 2 g via INTRAVENOUS

## 2013-11-29 MED ORDER — 0.9 % SODIUM CHLORIDE (POUR BTL) OPTIME
TOPICAL | Status: DC | PRN
  Administered 2013-11-29: 1000 mL

## 2013-11-29 MED ORDER — PROPOFOL 10 MG/ML IV BOLUS
INTRAVENOUS | Status: AC
  Filled 2013-11-29: qty 20

## 2013-11-29 MED ORDER — ACETAMINOPHEN 325 MG PO TABS: 650.0000 mg | ORAL_TABLET | Freq: Four times a day (QID) | ORAL | Status: DC | PRN

## 2013-11-29 MED ORDER — BIOTENE DRY MOUTH MT LIQD: 15.0000 mL | Freq: Two times a day (BID) | OROMUCOSAL | Status: DC

## 2013-11-29 MED ORDER — METOCLOPRAMIDE HCL 10 MG PO TABS: 5.0000 mg | ORAL_TABLET | Freq: Three times a day (TID) | ORAL | Status: DC | PRN

## 2013-11-29 MED ORDER — EPHEDRINE SULFATE 50 MG/ML IJ SOLN
INTRAMUSCULAR | Status: DC | PRN
  Administered 2013-11-29 (×2): 10 mg via INTRAVENOUS

## 2013-11-29 MED ORDER — MORPHINE SULFATE 2 MG/ML IJ SOLN
0.5000 mg | INTRAMUSCULAR | Status: DC | PRN
Start: 2013-11-29 — End: 2013-12-01

## 2013-11-29 MED ORDER — BIOTENE DRY MOUTH MT LIQD
15.0000 mL | Freq: Two times a day (BID) | OROMUCOSAL | Status: DC
  Administered 2013-11-29 – 2013-12-01 (×3): 15 mL via OROMUCOSAL

## 2013-11-29 MED ORDER — ACETAMINOPHEN 10 MG/ML IV SOLN
1000.0000 mg | Freq: Once | INTRAVENOUS | Status: AC
  Administered 2013-11-29: 1000 mg via INTRAVENOUS
  Filled 2013-11-29: qty 100

## 2013-11-29 MED ORDER — ACETAMINOPHEN 650 MG RE SUPP: 650.0000 mg | Freq: Four times a day (QID) | RECTAL | Status: DC | PRN

## 2013-11-29 MED ORDER — ASPIRIN EC 325 MG PO TBEC
325.0000 mg | DELAYED_RELEASE_TABLET | Freq: Every day | ORAL | Status: DC
  Administered 2013-11-29 – 2013-12-01 (×3): 325 mg via ORAL
  Filled 2013-11-29 (×4): qty 1

## 2013-11-29 MED ORDER — FENTANYL CITRATE 0.05 MG/ML IJ SOLN
INTRAMUSCULAR | Status: DC | PRN
  Administered 2013-11-29 (×2): 50 ug via INTRAVENOUS

## 2013-11-29 MED ORDER — LACTATED RINGERS IV SOLN
INTRAVENOUS | Status: DC | PRN
  Administered 2013-11-29 (×2): via INTRAVENOUS

## 2013-11-29 MED ORDER — SODIUM CHLORIDE 0.9 % IJ SOLN
INTRAMUSCULAR | Status: AC
  Filled 2013-11-29: qty 10

## 2013-11-29 MED ORDER — MENTHOL 3 MG MT LOZG
1.0000 | LOZENGE | OROMUCOSAL | Status: DC | PRN
  Filled 2013-11-29: qty 9

## 2013-11-29 MED ORDER — FENTANYL CITRATE 0.05 MG/ML IJ SOLN: 25.0000 ug | INTRAMUSCULAR | Status: DC | PRN

## 2013-11-29 MED ORDER — EPHEDRINE SULFATE 50 MG/ML IJ SOLN
INTRAMUSCULAR | Status: AC
  Filled 2013-11-29: qty 1

## 2013-11-29 MED ORDER — PROPOFOL INFUSION 10 MG/ML OPTIME
INTRAVENOUS | Status: DC | PRN
  Administered 2013-11-29: 140 ug/kg/min via INTRAVENOUS

## 2013-11-29 MED ORDER — CEFAZOLIN SODIUM-DEXTROSE 2-3 GM-% IV SOLR
2.0000 g | Freq: Three times a day (TID) | INTRAVENOUS | Status: AC
Start: 2013-11-29 — End: 2013-11-29
  Administered 2013-11-29 (×2): 2 g via INTRAVENOUS
  Filled 2013-11-29 (×2): qty 50

## 2013-11-29 MED ORDER — CEFAZOLIN SODIUM-DEXTROSE 2-3 GM-% IV SOLR
INTRAVENOUS | Status: AC
  Filled 2013-11-29: qty 50

## 2013-11-29 MED ORDER — METOCLOPRAMIDE HCL 5 MG/ML IJ SOLN: 5.0000 mg | Freq: Three times a day (TID) | INTRAMUSCULAR | Status: DC | PRN

## 2013-11-29 MED ORDER — ONDANSETRON HCL 4 MG PO TABS: 4.0000 mg | ORAL_TABLET | Freq: Four times a day (QID) | ORAL | Status: DC | PRN

## 2013-11-29 MED ORDER — HYDROCODONE-ACETAMINOPHEN 5-325 MG PO TABS
1.0000 | ORAL_TABLET | Freq: Four times a day (QID) | ORAL | Status: DC | PRN
  Administered 2013-11-30 – 2013-12-01 (×2): 2 via ORAL
  Filled 2013-11-29 (×2): qty 2

## 2013-11-29 MED ORDER — ONDANSETRON HCL 4 MG/2ML IJ SOLN: 4.0000 mg | Freq: Four times a day (QID) | INTRAMUSCULAR | Status: DC | PRN

## 2013-11-29 MED ORDER — MEPERIDINE HCL 50 MG/ML IJ SOLN: 6.2500 mg | INTRAMUSCULAR | Status: DC | PRN

## 2013-11-29 MED ORDER — FENTANYL CITRATE 0.05 MG/ML IJ SOLN
INTRAMUSCULAR | Status: AC
  Filled 2013-11-29: qty 2

## 2013-11-29 MED ORDER — BUPIVACAINE HCL (PF) 0.75 % IJ SOLN
INTRAMUSCULAR | Status: DC | PRN
  Administered 2013-11-29: 1.6 mL

## 2013-11-29 SURGICAL SUPPLY — 43 items
BLADE SURG 15 STRL LF DISP TIS (BLADE) ×1 IMPLANT
BLADE SURG 15 STRL SS (BLADE) ×2
BLADE SURG ROTATE 9660 (MISCELLANEOUS) IMPLANT
COVER SURGICAL LIGHT HANDLE (MISCELLANEOUS) ×3 IMPLANT
DRAPE ORTHO SPLIT 77X108 STRL (DRAPES)
DRAPE PROXIMA HALF (DRAPES) IMPLANT
DRAPE STERI IOBAN 125X83 (DRAPES) ×3 IMPLANT
DRAPE SURG ORHT 6 SPLT 77X108 (DRAPES) IMPLANT
DRSG MEPILEX BORDER 4X4 (GAUZE/BANDAGES/DRESSINGS) ×6 IMPLANT
DRSG MEPILEX BORDER 4X8 (GAUZE/BANDAGES/DRESSINGS) IMPLANT
DURAPREP 26ML APPLICATOR (WOUND CARE) ×3 IMPLANT
ELECT REM PT RETURN 9FT ADLT (ELECTROSURGICAL) ×3
ELECTRODE REM PT RTRN 9FT ADLT (ELECTROSURGICAL) ×1 IMPLANT
FACESHIELD LNG OPTICON STERILE (SAFETY) IMPLANT
GAUZE XEROFORM 5X9 LF (GAUZE/BANDAGES/DRESSINGS) ×3 IMPLANT
GLOVE BIOGEL PI IND STRL 8 (GLOVE) ×1 IMPLANT
GLOVE BIOGEL PI INDICATOR 8 (GLOVE) ×2
GLOVE SURG ORTHO 8.0 STRL STRW (GLOVE) ×3 IMPLANT
GOWN STRL REUS W/ TWL XL LVL3 (GOWN DISPOSABLE) IMPLANT
GOWN STRL REUS W/TWL LRG LVL3 (GOWN DISPOSABLE) ×3 IMPLANT
GOWN STRL REUS W/TWL XL LVL3 (GOWN DISPOSABLE)
GUIDE PIN 3.2 LONG (PIN) ×3 IMPLANT
GUIDE PIN 3.2MM (MISCELLANEOUS) ×2
GUIDE PIN ORTH 343X3.2XBRAD (MISCELLANEOUS) ×1 IMPLANT
HIP SCREW SET (Screw) ×3 IMPLANT
KIT BASIN OR (CUSTOM PROCEDURE TRAY) ×3 IMPLANT
KIT ROOM TURNOVER OR (KITS) ×3 IMPLANT
MANIFOLD NEPTUNE II (INSTRUMENTS) ×3 IMPLANT
NAIL IMHS 10X40 (Nail) ×3 IMPLANT
NS IRRIG 1000ML POUR BTL (IV SOLUTION) ×3 IMPLANT
PACK GENERAL/GYN (CUSTOM PROCEDURE TRAY) ×3 IMPLANT
PAD ARMBOARD 7.5X6 YLW CONV (MISCELLANEOUS) ×6 IMPLANT
SCREW COMPRESSION (Screw) ×3 IMPLANT
SCREW LAG 90MM (Screw) ×2 IMPLANT
SCREW LAGSTD 90X21X12.7X9 (Screw) ×1 IMPLANT
SPONGE LAP 4X18 X RAY DECT (DISPOSABLE) IMPLANT
STAPLER VISISTAT 35W (STAPLE) ×3 IMPLANT
SUT ETHILON 2 0 FS 18 (SUTURE) IMPLANT
SUT VIC AB 2-0 CTB1 (SUTURE) ×3 IMPLANT
TAPE STRIPS DRAPE STRL (GAUZE/BANDAGES/DRESSINGS) IMPLANT
TOWEL OR 17X24 6PK STRL BLUE (TOWEL DISPOSABLE) ×3 IMPLANT
TOWEL OR 17X26 10 PK STRL BLUE (TOWEL DISPOSABLE) ×3 IMPLANT
WATER STERILE IRR 1000ML POUR (IV SOLUTION) IMPLANT

## 2013-11-29 NOTE — Anesthesia Procedure Notes (Signed)
Spinal  Patient location during procedure: OR Preanesthetic Checklist Completed: patient identified, site marked, surgical consent, pre-op evaluation, timeout performed, IV checked, risks and benefits discussed and monitors and equipment checked Spinal Block Patient position: left lateral decubitus Prep: DuraPrep Patient monitoring: heart rate, cardiac monitor, continuous pulse ox and blood pressure Approach: midline Location: L3-4 Injection technique: single-shot Needle Needle type: Sprotte and Quincke  Needle gauge: 22 G Needle length: 9 cm Assessment Sensory level: T4 Additional Notes Spinal Dosage in OR  Bupivicaine ml       1.6

## 2013-11-29 NOTE — Anesthesia Postprocedure Evaluation (Signed)
  Anesthesia Post-op Note  Patient: [name] [name]  Procedure(s) Performed: Procedure(s): INTRAMEDULLARY (IM) NAIL INTERTROCHANTRIC (Left)  Patient is awake and responsive. Pain and nausea are reasonably well controlled. Vital signs are stable and clinically acceptable. Oxygen saturation is clinically acceptable. There are no apparent anesthetic complications at this time. Patient is ready for discharge.

## 2013-11-29 NOTE — Progress Notes (Signed)
PT  Note  Patient Details Name: Lovett Calenderhilip Kinn MRN: [id number] DOB: 1935/01/19   Cancelled Treatment:    Reason Eval/Treat Not Completed: Other (comment) (pt will initiate evaluation in AM/ 11/30/13 due to lateness of surgery.)   Rada HayHill, Quindell Shere Elizabeth 11/29/2013, 3:48 PM Blanchard KelchKaren Genoa Freyre PT [phone]

## 2013-11-29 NOTE — Brief Op Note (Signed)
11/28/2013 - 11/29/2013  3:16 AM  PATIENT:  [name] CalenderPhilip [name]  79 y.o. male  PRE-OPERATIVE DIAGNOSIS:  Left intertrochantic fracture  POST-OPERATIVE DIAGNOSIS:  Left intertrochantic fracture  PROCEDURE:  Procedure(s): INTRAMEDULLARY (IM) NAIL INTERTROCHANTRIC  SURGEON:  Surgeon(s): Cammy CopaGregory Scott Dean, MD  ASSISTANT: none  ANESTHESIA:   spinal  EBL: 50 ml    Total I/O In: 1000 [I.V.:1000] Out: 100 [Blood:100]  BLOOD ADMINISTERED: none  DRAINS: none   LOCAL MEDICATIONS USED:  none  SPECIMEN:  No Specimen  COUNTS:  YES  TOURNIQUET:  * No tourniquets in log *  DICTATION: .Other Dictation: Dictation Number [id number]  PLAN OF CARE: Admit to inpatient   PATIENT DISPOSITION:  PACU - hemodynamically stable

## 2013-11-29 NOTE — Progress Notes (Signed)
Informed patient's wife that the pharmacy here does not carry dextramorphone with quinide (patient takes this at the nursing home).  Requested that wife bring in medication if possible.  Wife verbally confirmed understanding.

## 2013-11-29 NOTE — Preoperative (Signed)
Beta Blockers   Reason not to administer Beta Blockers:Not Applicable 

## 2013-11-29 NOTE — Transfer of Care (Signed)
Immediate Anesthesia Transfer of Care Note  Patient: [name] CalenderPhilip [name]  Procedure(s) Performed: Procedure(s): INTRAMEDULLARY (IM) NAIL INTERTROCHANTRIC (Left)  Patient Location: PACU  Anesthesia Type:Spinal  Level of Consciousness: sedated  Airway & Oxygen Therapy: Patient Spontanous Breathing and Patient connected to face mask oxygen  Post-op Assessment: Report given to PACU RN and Post -op Vital signs reviewed and stable  Post vital signs: Reviewed and stable  Complications: No apparent anesthesia complications

## 2013-11-29 NOTE — Progress Notes (Signed)
Subjective: Pt stable - resting   Objective: Vital signs in last 24 hours: Temp:  [97.5 F (36.4 C)-98.1 F (36.7 C)] 97.9 F (36.6 C) (02/07 1036) Pulse Rate:  [57-98] 86 (02/07 1036) Resp:  [10-20] 20 (02/07 1036) BP: (106-162)/(50-108) 140/94 mmHg (02/07 1036) SpO2:  [93 %-100 %] 100 % (02/07 1036) Weight:  [78.926 kg (174 lb)-81.2 kg (179 lb 0.2 oz)] 81.2 kg (179 lb 0.2 oz) (02/07 0501)  Intake/Output from previous day: 02/06 0701 - 02/07 0700 In: 1800 [I.V.:1800] Out: 100 [Blood:100] Intake/Output this shift: Total I/O In: 240 [P.O.:240] Out: -   Exam:  No cellulitis present Compartment soft  Labs:  Recent Labs  11/28/13 2055 11/29/13 0629  HGB 12.1* 11.0*    Recent Labs  11/28/13 2055 11/29/13 0629  WBC 7.4 8.0  RBC 3.88* 3.57*  HCT 35.5* 33.2*  PLT 178 148*    Recent Labs  11/28/13 2055 11/29/13 0510  NA 138 138  K 3.7 4.2  CL 102 102  CO2 26 24  BUN 10 11  CREATININE 0.68 0.69  GLUCOSE 139* 130*  CALCIUM 8.6 8.2*   No results found for this basename: LABPT, INR,  in the last 72 hours  Assessment/Plan: Plan to mobilize today - oob to chair - pwb for transfers - placement pending   DEAN,GREGORY SCOTT 11/29/2013, 12:04 PM

## 2013-11-29 NOTE — Progress Notes (Signed)
UR completed 

## 2013-11-29 NOTE — Progress Notes (Signed)
[facility] PROGRESS NOTE  Mehmet Scally KGM:[id number] DOB: 18-Oct-1935 DOA: 11/28/2013 PCP: Florentina Jenny, MD  Assessment/Plan: Comminuted left intertrochanteric fracture  -Dr. Dorene Grebe - Followed by Ortho, s/p surgery  2/7 - PT/OT Anxiety  -Continue Ativan as needed Depression  -Continue Celexa  -Continue Seroquel  BPH  -Proscar and Cardura  HLD  -Continue  HTN  -See BPH  Abnormal EKG  -Obtain troponin x3  -Obtain proBNP  -Obtain TSH  Code Status: Full Family Communication: Pt in room (indicate person spoken with, relationship, and if by phone, the number) Disposition Plan: Pending   Consultants:  Orthopedic surgery  Procedures:  L intertrochantic fracture repair 11/29/13  HPI/Subjective: Agitated this AM. No other acute events noted.  Objective: Filed Vitals:   11/29/13 0415 11/29/13 0430 11/29/13 0501 11/29/13 0515  BP: 114/50 109/55 128/76 106/65  Pulse: 59 59 64 71  Temp:  97.6 F (36.4 C) 97.9 F (36.6 C) 98.1 F (36.7 C)  TempSrc:   Axillary Axillary  Resp: 10 16 18 18   Height:   5\' 9"  (1.753 m)   Weight:   81.2 kg (179 lb 0.2 oz)   SpO2: 100% 100% 100% 100%    Intake/Output Summary (Last 24 hours) at 11/29/13 0852 Last data filed at 11/29/13 0500  Gross per 24 hour  Intake   1800 ml  Output    100 ml  Net   1700 ml   Filed Weights   11/29/13 0040 11/29/13 0501  Weight: 78.926 kg (174 lb) 81.2 kg (179 lb 0.2 oz)    Exam:   General:  Awake, in nad  Cardiovascular: regular, s1, s2  Respiratory: normal resp effort, no wheezing  Abdomen: soft, nondistended  Musculoskeletal: perfused,no clubbing   Data Reviewed: Basic Metabolic Panel:  Recent Labs Lab 11/28/13 2055 11/29/13 0510  NA 138 138  K 3.7 4.2  CL 102 102  CO2 26 24  GLUCOSE 139* 130*  BUN 10 11  CREATININE 0.68 0.69  CALCIUM 8.6 8.2*  MG  --  2.0   Liver Function Tests:  Recent Labs Lab 11/29/13 0510  AST 23  ALT 15  ALKPHOS 79  BILITOT 0.8   PROT 5.9*  ALBUMIN 3.0*   No results found for this basename: LIPASE, AMYLASE,  in the last 168 hours No results found for this basename: AMMONIA,  in the last 168 hours CBC:  Recent Labs Lab 11/28/13 2055 11/29/13 0629  WBC 7.4 8.0  NEUTROABS 6.1 5.9  HGB 12.1* 11.0*  HCT 35.5* 33.2*  MCV 91.5 93.0  PLT 178 148*   Cardiac Enzymes:  Recent Labs Lab 11/29/13 0019 11/29/13 0510  TROPONINI <0.30 <0.30   BNP (last 3 results)  Recent Labs  11/29/13 0019  PROBNP 281.9   CBG: No results found for this basename: GLUCAP,  in the last 168 hours  No results found for this or any previous visit (from the past 240 hour(s)).   Studies: Dg Chest 2 View  11/28/2013   CLINICAL DATA:  Larey Seat.  History of hypertension.  EXAM: CHEST  2 VIEW  COMPARISON:  None.  FINDINGS: Poor inspiration. Mildly enlarged cardiac silhouette. Clear lungs. Thoracic spine degenerative changes. No fracture or pneumothorax seen.  IMPRESSION: No acute abnormality.   Electronically Signed   By: Gordan Payment M.D.   On: 11/28/2013 19:39   Dg Forearm Left  11/28/2013   CLINICAL DATA:  Left forearm pain following injury  EXAM: LEFT FOREARM - 2 VIEW  COMPARISON:  None.  FINDINGS: There is no evidence of fracture or other focal bone lesions. Soft tissues are unremarkable.  IMPRESSION: No acute abnormality noted.   Electronically Signed   By: Alcide CleverMark  Lukens M.D.   On: 11/28/2013 19:39   Dg Hip Complete Left  11/28/2013   CLINICAL DATA:  Left hip pain following a fall.  EXAM: LEFT HIP - COMPLETE 2+ VIEW  COMPARISON:  None.  FINDINGS: Comminuted left intertrochanteric fracture with varus angulation. There is also posterior angulation and 1/2 shaft width of anterior displacement of the distal fragment.  IMPRESSION: Comminuted left intertrochanteric fracture, as described above.   Electronically Signed   By: Gordan PaymentSteve  Reid M.D.   On: 11/28/2013 19:40   Dg Femur Left  11/29/2013   CLINICAL DATA:  79 year old male undergoing left  proximal femur repair. Initial encounter.  EXAM: LEFT FEMUR - 2 VIEW; DG C-ARM 1-60 MIN - NRPT [facility]  COMPARISON:  Preoperative study 11/28/2013.  FLUOROSCOPY TIME:  1 minute and 4 seconds.  FINDINGS: Five intraoperative fluoroscopic views demonstrate left femur intra medullary rod placement with proximal interlocking dynamic hip screw. Near anatomic alignment about the comminuted intertrochanteric fracture.  IMPRESSION: Left proximal femur ORIF with no adverse features.   Electronically Signed   By: Augusto GambleLee  Hall M.D.   On: 11/29/2013 03:36   Ct Head Wo Contrast  11/28/2013   CLINICAL DATA:  Pain post trauma  EXAM: CT HEAD WITHOUT CONTRAST  CT CERVICAL SPINE WITHOUT CONTRAST  TECHNIQUE: Multidetector CT imaging of the head and cervical spine was performed following the standard protocol without intravenous contrast. Multiplanar CT image reconstructions of the cervical spine were also generated.  COMPARISON:  None.  FINDINGS: CT HEAD FINDINGS  There is moderately severe diffuse atrophy. There is no mass, hemorrhage, extra-axial fluid collection, or midline shift. There is small vessel disease throughout the centra semiovale bilaterally. No acute appearing infarct is seen, however.  Bony calvarium appears intact. The mastoid air cells are clear. There is debris in both external auditory canals.  CT CERVICAL SPINE FINDINGS  There is no fracture. There is slight anterolisthesis of C3 on C4, felt to be due to spondylosis. There is no other appreciable spondylolisthesis. Prevertebral soft tissues and predental space regions are normal.  There is disc space narrowing, marked, at C4-5, C5-6, and C6-7. There is moderate disc space narrowing at C3-4 and C7-T1. There is slightly milder narrowing at C2-3. There is moderate facet hypertrophy at all levels bilaterally. There is no frank disc extrusion or stenosis appreciable.  There is a nodular lesion in the left lobe of the thyroid measuring 1.1 x 0.8 cm.  IMPRESSION: CT head:  Extensive atrophy with small vessel disease throughout the centra semiovale bilaterally. There is no intracranial mass, hemorrhage, or acute appearing infarct. There is probable cerumen in both external auditory canals.  CT cervical spine: Multilevel osteoarthritic change. Slight spondylolisthesis is C3-4 is felt to be due to spondylosis. No fracture apparent.  There is a focal nodular lesion in the left lobe of the thyroid. Nonemergent thyroid ultrasound advised to further assess.   Electronically Signed   By: Bretta BangWilliam  Woodruff M.D.   On: 11/28/2013 15:26   Ct Cervical Spine Wo Contrast  11/28/2013   CLINICAL DATA:  Pain post trauma  EXAM: CT HEAD WITHOUT CONTRAST  CT CERVICAL SPINE WITHOUT CONTRAST  TECHNIQUE: Multidetector CT imaging of the head and cervical spine was performed following the standard protocol without intravenous contrast. Multiplanar CT image reconstructions of the cervical  spine were also generated.  COMPARISON:  None.  FINDINGS: CT HEAD FINDINGS  There is moderately severe diffuse atrophy. There is no mass, hemorrhage, extra-axial fluid collection, or midline shift. There is small vessel disease throughout the centra semiovale bilaterally. No acute appearing infarct is seen, however.  Bony calvarium appears intact. The mastoid air cells are clear. There is debris in both external auditory canals.  CT CERVICAL SPINE FINDINGS  There is no fracture. There is slight anterolisthesis of C3 on C4, felt to be due to spondylosis. There is no other appreciable spondylolisthesis. Prevertebral soft tissues and predental space regions are normal.  There is disc space narrowing, marked, at C4-5, C5-6, and C6-7. There is moderate disc space narrowing at C3-4 and C7-T1. There is slightly milder narrowing at C2-3. There is moderate facet hypertrophy at all levels bilaterally. There is no frank disc extrusion or stenosis appreciable.  There is a nodular lesion in the left lobe of the thyroid measuring 1.1 x  0.8 cm.  IMPRESSION: CT head: Extensive atrophy with small vessel disease throughout the centra semiovale bilaterally. There is no intracranial mass, hemorrhage, or acute appearing infarct. There is probable cerumen in both external auditory canals.  CT cervical spine: Multilevel osteoarthritic change. Slight spondylolisthesis is C3-4 is felt to be due to spondylosis. No fracture apparent.  There is a focal nodular lesion in the left lobe of the thyroid. Nonemergent thyroid ultrasound advised to further assess.   Electronically Signed   By: Bretta Bang M.D.   On: 11/28/2013 15:26   Dg Pelvis Portable  11/29/2013   CLINICAL DATA:  79 year old male status post left femoral ORIF. Initial encounter.  EXAM: PORTABLE PELVIS 1-2 VIEWS  COMPARISON:  0323 hr the same day and earlier.  FINDINGS: Portable AP view at 0416 hrs. Partially visible left femur intra medullary rod with proximal interlocking dynamic hip screw. The distal aspect of the IM rod is not included. Near anatomic alignment of the left intertrochanteric fracture. Overlying postoperative changes to the soft tissues. Visible pelvis appears stable.  IMPRESSION: Left femur ORIF with no adverse features.   Electronically Signed   By: Augusto Gamble M.D.   On: 11/29/2013 04:50   Dg C-arm 1-60 Min-no Report  11/29/2013   CLINICAL DATA:  79 year old male undergoing left proximal femur repair. Initial encounter.  EXAM: LEFT FEMUR - 2 VIEW; DG C-ARM 1-60 MIN - NRPT [facility]  COMPARISON:  Preoperative study 11/28/2013.  FLUOROSCOPY TIME:  1 minute and 4 seconds.  FINDINGS: Five intraoperative fluoroscopic views demonstrate left femur intra medullary rod placement with proximal interlocking dynamic hip screw. Near anatomic alignment about the comminuted intertrochanteric fracture.  IMPRESSION: Left proximal femur ORIF with no adverse features.   Electronically Signed   By: Augusto Gamble M.D.   On: 11/29/2013 03:36    Scheduled Meds: . aspirin EC  325 mg Oral Q breakfast   .  ceFAZolin (ANCEF) IV  2 g Intravenous Q8H  . citalopram  10 mg Oral Daily  . Dextromethorphan-Quinidine  1 capsule Oral BID  . docusate sodium  100 mg Oral BID  . doxazosin  4 mg Oral QHS  . finasteride  5 mg Oral Daily  . LORazepam  0.5 mg Oral BID  . pantoprazole  40 mg Oral Daily  . QUEtiapine  25 mg Oral Daily  . QUEtiapine  50 mg Oral QHS  . senna  1 tablet Oral BID  . simvastatin  10 mg Oral q1800   Continuous Infusions: .  0.9 % NaCl with KCl 20 mEq / L 75 mL/hr at 11/29/13 5621    Active Problems:   Closed left hip fracture   Alzheimer's dementia   Depression   Anxiety   HLD (hyperlipidemia)   BPH (benign prostatic hyperplasia)   Abnormal EKG   Hip fracture  Time spent:  Obrian Bulson K  [facility] Pager [phone]. If 7PM-7AM, please contact night-coverage at [email or web address], password [facility] 11/29/2013, 8:52 AM  LOS: 1 day

## 2013-11-29 NOTE — Plan of Care (Signed)
Pt's wife reports pt is DNR. Orders placed.

## 2013-11-29 NOTE — Anesthesia Preprocedure Evaluation (Addendum)
Anesthesia Evaluation  Patient identified by MRN, date of birth, ID band Patient awake    Reviewed: Allergy & Precautions, H&P , Patient's Chart, lab work & pertinent test results  Airway Mallampati: II TM Distance: >3 FB Neck ROM: full    Dental no notable dental hx.    Pulmonary  breath sounds clear to auscultation  Pulmonary exam normal       Cardiovascular Exercise Tolerance: Good hypertension, On Medications Rhythm:regular Rate:Normal     Neuro/Psych PSYCHIATRIC DISORDERS    GI/Hepatic   Endo/Other    Renal/GU      Musculoskeletal   Abdominal   Peds  Hematology   Anesthesia Other Findings Wife said he could walk several miles within the last year before Dementia set in Airway appears adequate; no cooperation with exam  Reproductive/Obstetrics                          Anesthesia Physical Anesthesia Plan  ASA: II  Anesthesia Plan: Spinal   Post-op Pain Management:    Induction:   Airway Management Planned:   Additional Equipment:   Intra-op Plan:   Post-operative Plan:   Informed Consent: I have reviewed the patients History and Physical, chart, labs and discussed the procedure including the risks, benefits and alternatives for the proposed anesthesia with the patient or authorized representative who has indicated his/her understanding and acceptance.   Dental Advisory Given  Plan Discussed with: CRNA  Anesthesia Plan Comments: (Lab work confirmed with CRNA in room. Platelets okay. Discussed spinal anesthetic, and patient consents to the procedure:  included risk of possible headache,backache, failed block, allergic reaction, and nerve injury. This patient was asked if she had any questions or concerns before the procedure started. )        Anesthesia Quick Evaluation

## 2013-11-30 LAB — CBC
HCT: 31 % — ABNORMAL LOW (ref 39.0–52.0)
Hemoglobin: 10.5 g/dL — ABNORMAL LOW (ref 13.0–17.0)
MCH: 31.3 pg (ref 26.0–34.0)
MCHC: 33.9 g/dL (ref 30.0–36.0)
MCV: 92.3 fL (ref 78.0–100.0)
PLATELETS: 153 10*3/uL (ref 150–400)
RBC: 3.36 MIL/uL — AB (ref 4.22–5.81)
RDW: 12.6 % (ref 11.5–15.5)
WBC: 7.3 10*3/uL (ref 4.0–10.5)

## 2013-11-30 LAB — BASIC METABOLIC PANEL
BUN: 8 mg/dL (ref 6–23)
CHLORIDE: 101 meq/L (ref 96–112)
CO2: 26 meq/L (ref 19–32)
Calcium: 8.2 mg/dL — ABNORMAL LOW (ref 8.4–10.5)
Creatinine, Ser: 0.57 mg/dL (ref 0.50–1.35)
GFR calc Af Amer: >90 mL/min (ref >90)
GFR calc non Af Amer: >90 mL/min (ref >90)
Glucose, Bld: 127 mg/dL — ABNORMAL HIGH (ref 70–99)
Potassium: 3.9 mEq/L (ref 3.7–5.3)
SODIUM: 136 meq/L — AB (ref 137–147)

## 2013-11-30 LAB — PROTIME-INR
INR: 1.23 (ref 0.00–1.49)
Prothrombin Time: 15.2 seconds (ref 11.6–15.2)

## 2013-11-30 MED ORDER — LORAZEPAM 2 MG/ML IJ SOLN: 0.5000 mg | INTRAMUSCULAR | Status: DC | PRN

## 2013-11-30 MED ORDER — ASPIRIN EC 325 MG PO TBEC: 325.0000 mg | DELAYED_RELEASE_TABLET | Freq: Every day | ORAL | Status: AC

## 2013-11-30 MED ORDER — HYDROCODONE-ACETAMINOPHEN 5-325 MG PO TABS: 1.0000 | ORAL_TABLET | Freq: Four times a day (QID) | ORAL | Status: DC | PRN

## 2013-11-30 MED ORDER — LORAZEPAM 2 MG/ML IJ SOLN: 0.5000 mg | Freq: Four times a day (QID) | INTRAMUSCULAR | Status: DC | PRN

## 2013-11-30 NOTE — Evaluation (Signed)
Physical Therapy Evaluation Patient Details Name: [name] [name] MRN: [id number] DOB: 1935/07/07 Today's Date: 11/30/2013 Time: 4098-1191 PT Time Calculation (min): 36 min  PT Assessment / Plan / Recommendation History of Present Illness  HPI: [name] [name] is a 79 y.o. WM PMHx depression, dementia, Alzheimer disease, anxiety, HLD, BPH, who fell at facility and sustained L IT hip fx, s/p IM nail on 11/28/13(late surgery) Pt is from a skilled facility.  Clinical Impression  Pt unable to respond to directions, mumbles  At times. RN reports that wife states pt was ambulatory PTA. Today pt is  Quire rigid to roll in bed. Appears discomfort of L hip. Pt will require mechanical lift OOB until more alert and able to participate. Pt will benefit from a trial of PT to mobilize.     PT Assessment  Patient needs continued PT services    Follow Up Recommendations  SNF;Supervision/Assistance - 24 hour    Does the patient have the potential to tolerate intense rehabilitation      Barriers to Discharge        Equipment Recommendations  None recommended by PT    Recommendations for Other Services     Frequency Min 3X/week    Precautions / Restrictions Precautions Precautions: Fall Restrictions Weight Bearing Restrictions: Yes LLE Weight Bearing: Partial weight bearing LLE Partial Weight Bearing Percentage or Pounds: 25%   Pertinent Vitals/Pain Moans when rolled to L.      Mobility  Bed Mobility Overal bed mobility: Needs Assistance;+2 for physical assistance;+ 2 for safety/equipment Bed Mobility: Rolling Rolling: Total assist;+2 for physical assistance;+2 for safety/equipment General bed mobility comments: Pt very rigid in trunk and legs when rolling to each side for pericare as pt was incontinent of urine. Transfers General transfer comment: NT due to pt lethargy except when moved, then becomes mildly aggitated.    Exercises     PT Diagnosis: Difficulty walking;Acute pain  PT  Problem List: Decreased strength;Decreased range of motion;Decreased activity tolerance;Decreased mobility;Decreased cognition;Pain PT Treatment Interventions: Functional mobility training;Therapeutic activities;Therapeutic exercise;Patient/family education     PT Goals(Current goals can be found in the care plan section) Acute Rehab PT Goals Patient Stated Goal: unable to respond PT Goal Formulation: Patient unable to participate in goal setting Time For Goal Achievement: 12/14/13 Potential to Achieve Goals: Fair  Visit Information  Last PT Received On: 11/30/13 Assistance Needed: +2 PT/OT/SLP Co-Evaluation/Treatment: Yes Reason for Co-Treatment: For patient/therapist safety;Complexity of the patient's impairments (multi-system involvement) PT goals addressed during session: Mobility/safety with mobility History of Present Illness: HPI: [name] [name] is a 78 y.o. WM PMHx depression, dementia, Alzheimer disease, anxiety, HLD, BPH, who fell at facility and sustained L IT hip fx, s/p IM nail on 11/28/13(late surgery) Pt is from a skilled facility.       Prior Functioning  Home Living Family/patient expects to be discharged to:: Skilled nursing facility Communication Communication:  (at times pt mumbles)    Cognition  Cognition Arousal/Alertness: Lethargic Behavior During Therapy: Restless;Agitated (when moving LLE and rolling.) Overall Cognitive Status: No family/caregiver present to determine baseline cognitive functioning    Extremity/Trunk Assessment Upper Extremity Assessment Upper Extremity Assessment: Defer to OT evaluation Lower Extremity Assessment Lower Extremity Assessment: RLE deficits/detail;LLE deficits/detail RLE Deficits / Details: pt did flex hip to 45* LLE Deficits / Details: pt keeps L leg extended and tends to IR LLE: Unable to fully assess due to pain   Balance    End of Session PT - End of Session Activity Tolerance: Patient limited  by lethargy;Patient  limited by pain Patient left: in bed;with call bell/phone within reach;with bed alarm set Nurse Communication: Mobility status;Need for lift equipment  GP     Rada HayHill, Zakariyah Freimark Elizabeth 11/30/2013, 11:02 AM Blanchard KelchKaren Jupiter Boys PT [phone]

## 2013-11-30 NOTE — Progress Notes (Signed)
Clinical Social Work Department BRIEF PSYCHOSOCIAL ASSESSMENT 11/30/2013  Patient:  [name] [name],[name] [name]     Account Number:  [id number]     Admit date:  11/28/2013  Clinical Social Worker:  Levie Heritage  Date/Time:  11/30/2013 03:09 PM  Referred by:  Physician  Date Referred:  11/30/2013 Referred for  SNF Placement   Other Referral:   Interview type:  Other - See comment Other interview type:   Pt's wife at bedside    PSYCHOSOCIAL DATA Living Status:  FACILITY Admitted from facility:  [facility] Level of care:  Assisted Living Primary support name:  [name] Primary support relationship to patient:  SPOUSE Degree of support available:   strong    CURRENT CONCERNS Current Concerns  Post-Acute Placement   Other Concerns:    SOCIAL WORK ASSESSMENT / PLAN Met with Pt's wife to discuss d/c plans.  Pt not alert or oriented.    Pt's wife stated that Pt was at [facility] for 1 day before he fell and had to be hospitalized.  She and Pt just moved to [place] from [place] to be closer to their daughter.    Pt's wife is hopeful that Pt can go back to [facility] but did agree to [facility] as a back-up plan.    CSW thanked Pt's wife for her time.   Assessment/plan status:  Psychosocial Support/Ongoing Assessment of Needs Other assessment/ plan:   Information/referral to community resources:   SNF list  Medicare.gov website info    PATIENT'S/FAMILY'S RESPONSE TO PLAN OF CARE: Pt's wife was calm, cooperative and pleasant.    Pt's wife really doesn't want Pt to have to go to SNF but understands that it may be necessary.    Pt's wife is coping well with Pt's dementia, stating that he has had dementia for 10 years and that it's just progressively worsened.   Although she misses the beach, she is happy to be closer to her daughter so that she can help with her grandchildren.    Pt's wife thanked CSW for time and assistance.   [name],  [place] [facility] [phone]

## 2013-11-30 NOTE — Evaluation (Signed)
Occupational Therapy Evaluation Patient Details Name: [name] [name] MRN: [id number] DOB: 1934-12-25 Today's Date: 11/30/2013 Time: 0300-9233 OT Time Calculation (min): 36 min  OT Assessment / Plan / Recommendation History of present illness HPI: [name] is a 79 y.o. WM PMHx depression, dementia, Alzheimer disease, anxiety, HLD, BPH, who fell at facility and sustained L IT hip fx, s/p IM nail on 11/28/13(late surgery) Pt is from a skilled facility.   Clinical Impression   Pt present with lethargy, not following commands with some mumbling.  Pt indicated pain with movement of L LE by grimacing.  He required +2 total assist for bed mobility and total assist for all ADL.  Recommending return to SNF upon discharge.  Will defer OT to SNF.    OT Assessment  All further OT needs can be met in the next venue of care    Follow Up Recommendations  SNF    Barriers to Discharge      Equipment Recommendations       Recommendations for Other Services    Frequency       Precautions / Restrictions Precautions Precautions: Fall Restrictions Weight Bearing Restrictions: Yes LLE Weight Bearing: Partial weight bearing LLE Partial Weight Bearing Percentage or Pounds: 25%   Pertinent Vitals/Pain L LE with movement, unable to rate, repositioned, RN aware    ADL  Eating/Feeding: +1 Total assistance Where Assessed - Eating/Feeding: Bed level Grooming: Wash/dry face;+1 Total assistance Where Assessed - Grooming: Supine, head of bed up Upper Body Bathing: +1 Total assistance Where Assessed - Upper Body Bathing: Supine, head of bed up;Rolling right and/or left Lower Body Bathing: +2 Total assistance Lower Body Bathing: Patient Percentage: 0% Where Assessed - Lower Body Bathing: Supine, head of bed up;Rolling right and/or left Upper Body Dressing: +1 Total assistance Where Assessed - Upper Body Dressing: Supine, head of bed up Lower Body Dressing: +1 Total assistance Where Assessed - Lower  Body Dressing: Supine, head of bed up Toileting - Clothing Manipulation and Hygiene: +2 Total assistance Toileting - Clothing Manipulation and Hygiene: Patient Percentage: 0% Where Assessed - Toileting Clothing Manipulation and Hygiene: Supine, head of bed flat;Rolling right and/or left    OT Diagnosis: Generalized weakness;Cognitive deficits;Acute pain  OT Problem List: Decreased strength;Decreased activity tolerance;Decreased cognition;Impaired tone;Impaired UE functional use;Pain OT Treatment Interventions:     OT Goals(Current goals can be found in the care plan section) Acute Rehab OT Goals Patient Stated Goal: unable to respond  Visit Information  Last OT Received On: 11/30/13 Assistance Needed: +2 PT/OT/SLP Co-Evaluation/Treatment: Yes Reason for Co-Treatment: Complexity of the patient's impairments (multi-system involvement);For patient/therapist safety;Necessary to address cognition/behavior during functional activity PT goals addressed during session: Mobility/safety with mobility OT goals addressed during session: ADL's and self-care History of Present Illness: HPI: [name] is a 79 y.o. WM PMHx depression, dementia, Alzheimer disease, anxiety, HLD, BPH, who fell at facility and sustained L IT hip fx, s/p IM nail on 11/28/13(late surgery) Pt is from a skilled facility.       Prior [place] expects to be discharged to:: Skilled nursing facility Communication Communication: Expressive difficulties (mumbles some)         Vision/Perception     Cognition  Cognition Arousal/Alertness: Lethargic Behavior During Therapy: Restless;Agitated Overall Cognitive Status: No family/caregiver present to determine baseline cognitive functioning    Extremity/Trunk Assessment Upper Extremity Assessment Upper Extremity Assessment: RUE deficits/detail;LUE deficits/detail RUE Deficits / Details: rigidity, arthritic changes in hands LUE  Deficits /  Details: rigidity, arthritic changes in hands Lower Extremity Assessment Lower Extremity Assessment: Defer to PT evaluation RLE Deficits / Details: pt did flex hip to 45* LLE Deficits / Details: pt keeps L leg extended and tends to IR LLE: Unable to fully assess due to pain     Mobility Bed Mobility Overal bed mobility: Needs Assistance;+2 for physical assistance;+ 2 for safety/equipment Bed Mobility: Rolling Rolling: Total assist;+2 for physical assistance;+2 for safety/equipment General bed mobility comments: Pt very rigid in trunk and legs when rolling to each side for pericare as pt was incontinent of urine. Transfers General transfer comment: NT due to pt lethargy except when moved, then becomes mildly aggitated.     Exercise     Balance     End of Session OT - End of Session Activity Tolerance: Patient limited by pain;Patient limited by lethargy Patient left: in bed;with call bell/phone within reach Nurse Communication: Need for lift equipment (pt with urine soaked bed, condom cath off)  GO     Malka So 11/30/2013, 11:16 AM [phone]

## 2013-11-30 NOTE — Progress Notes (Signed)
Clinical Social Work Department CLINICAL SOCIAL WORK PLACEMENT NOTE 11/30/2013  Patient:  Behne,Eryn  Account Number:  [id number] Admit date:  11/28/2013  Clinical Social Worker:  Doroteo GlassmanAMANDA Charolette Bultman, LCSWA  Date/time:  11/30/2013 03:24 PM  Clinical Social Work is seeking post-discharge placement for this patient at the following level of care:   SKILLED NURSING   (*CSW will update this form in Epic as items are completed)   11/30/2013  Patient/family provided with [facility] of Clinical Social Work's list of facilities offering this level of care within the geographic area requested by the patient (or if unable, by the patient's family).  11/30/2013  Patient/family informed of their freedom to choose among providers that offer the needed level of care, that participate in Medicare, Medicaid or managed care program needed by the patient, have an available bed and are willing to accept the patient.  11/30/2013  Patient/family informed of [facility]' ownership interest in [facility], as well as of the fact that they are under no obligation to receive care at this facility.  PASARR submitted to EDS on 11/30/2013 PASARR number received from EDS on 11/30/2013  FL2 transmitted to all facilities in geographic area requested by pt/family on  11/30/2013 FL2 transmitted to all facilities within larger geographic area on   Patient informed that his/her managed care company has contracts with or will negotiate with  certain facilities, including the following:     Patient/family informed of bed offers received:   Patient chooses bed at  Physician recommends and patient chooses bed at    Patient to be transferred to  on   Patient to be transferred to facility by   The following physician request were entered in Epic:   Additional Comments:  Providence CrosbyAmanda Margaurite Salido, Theresia MajorsLCSWA Clinical Social Work [phone]

## 2013-11-30 NOTE — Progress Notes (Signed)
[facility] PROGRESS NOTE  [name] [name] ZOX:[id number] DOB: [birth year] DOA: 11/28/2013 PCP: Florentina Jenny, MD  Assessment/Plan: Comminuted left intertrochanteric fracture  -Dr. Dorene Grebe - Followed by Ortho, s/p surgery  2/7 - PT/OT - Stable thus far Anxiety  -Continue Ativan as needed Depression  -Continue Celexa  -Continue Seroquel  BPH  -Proscar and Cardura  HLD  -Continue  HTN  -See BPH  Abnormal EKG  -Troponin neg x3  -proBNP unremarkable -TSH normal  Code Status: DNR Family Communication: Pt in room Disposition Plan: Pending   Consultants:  Orthopedic surgery  Procedures:  L intertrochantic fracture repair 11/29/13  HPI/Subjective: No events noted overnight  Objective: Filed Vitals:   11/29/13 2148 11/29/13 2318 11/30/13 0204 11/30/13 0537  BP: 155/94  151/89 126/68  Pulse:  95 88 92  Temp:  98.9 F (37.2 C) 98.3 F (36.8 C) 98.2 F (36.8 C)  TempSrc:  Axillary Axillary Axillary  Resp:   20 20  Height:      Weight:      SpO2:  98% 99% 99%    Intake/Output Summary (Last 24 hours) at 11/30/13 0923 Last data filed at 11/30/13 0700  Gross per 24 hour  Intake   2450 ml  Output   1000 ml  Net   1450 ml   Filed Weights   11/29/13 0040 11/29/13 0501  Weight: 78.926 kg (174 lb) 81.2 kg (179 lb 0.2 oz)    Exam:   General:  Awake, in nad  Cardiovascular: regular, s1, s2  Respiratory: normal resp effort, no wheezing  Abdomen: soft, nondistended  Musculoskeletal: perfused,no clubbing   Data Reviewed: Basic Metabolic Panel:  Recent Labs Lab 11/28/13 2055 11/29/13 0510 11/30/13 0546  NA 138 138 136*  K 3.7 4.2 3.9  CL 102 102 101  CO2 26 24 26   GLUCOSE 139* 130* 127*  BUN 10 11 8   CREATININE 0.68 0.69 0.57  CALCIUM 8.6 8.2* 8.2*  MG  --  2.0  --    Liver Function Tests:  Recent Labs Lab 11/29/13 0510  AST 23  ALT 15  ALKPHOS 79  BILITOT 0.8  PROT 5.9*  ALBUMIN 3.0*   No results found for this basename:  LIPASE, AMYLASE,  in the last 168 hours No results found for this basename: AMMONIA,  in the last 168 hours CBC:  Recent Labs Lab 11/28/13 2055 11/29/13 0629 11/30/13 0546  WBC 7.4 8.0 7.3  NEUTROABS 6.1 5.9  --   HGB 12.1* 11.0* 10.5*  HCT 35.5* 33.2* 31.0*  MCV 91.5 93.0 92.3  PLT 178 148* 153   Cardiac Enzymes:  Recent Labs Lab 11/29/13 0019 11/29/13 0510 11/29/13 1120  TROPONINI <0.30 <0.30 <0.30   BNP (last 3 results)  Recent Labs  11/29/13 0019  PROBNP 281.9   CBG: No results found for this basename: GLUCAP,  in the last 168 hours  Recent Results (from the past 240 hour(s))  URINE CULTURE     Status: None   Collection Time    11/28/13  6:44 PM      Result Value Range Status   Specimen Description URINE, CLEAN CATCH   Final   Special Requests NONE   Final   Culture  Setup Time     Final   Value: 11/29/2013 00:42     Performed at [facility] Count     Final   Value: NO GROWTH     Performed at [employer]  Final   Value: NO GROWTH     Performed at [employer] [facility]   Report Status 11/29/2013 FINAL   Final  MRSA PCR SCREENING     Status: None   Collection Time    11/29/13  7:32 AM      Result Value Range Status   MRSA by PCR NEGATIVE  NEGATIVE Final   Comment:            The GeneXpert MRSA Assay (FDA     approved for NASAL specimens     only), is one component of a     comprehensive MRSA colonization     surveillance program. It is not     intended to diagnose MRSA     infection nor to guide or     monitor treatment for     MRSA infections.     Studies: Dg Chest 2 View  11/28/2013   CLINICAL DATA:  Larey Seat.  History of hypertension.  EXAM: CHEST  2 VIEW  COMPARISON:  None.  FINDINGS: Poor inspiration. Mildly enlarged cardiac silhouette. Clear lungs. Thoracic spine degenerative changes. No fracture or pneumothorax seen.  IMPRESSION: No acute abnormality.   Electronically Signed   By: Gordan Payment M.D.   On:  11/28/2013 19:39   Dg Forearm Left  11/28/2013   CLINICAL DATA:  Left forearm pain following injury  EXAM: LEFT FOREARM - 2 VIEW  COMPARISON:  None.  FINDINGS: There is no evidence of fracture or other focal bone lesions. Soft tissues are unremarkable.  IMPRESSION: No acute abnormality noted.   Electronically Signed   By: Alcide Clever M.D.   On: 11/28/2013 19:39   Dg Hip Complete Left  11/28/2013   CLINICAL DATA:  Left hip pain following a fall.  EXAM: LEFT HIP - COMPLETE 2+ VIEW  COMPARISON:  None.  FINDINGS: Comminuted left intertrochanteric fracture with varus angulation. There is also posterior angulation and 1/2 shaft width of anterior displacement of the distal fragment.  IMPRESSION: Comminuted left intertrochanteric fracture, as described above.   Electronically Signed   By: Gordan Payment M.D.   On: 11/28/2013 19:40   Dg Femur Left  11/29/2013   CLINICAL DATA:  79 year old male undergoing left proximal femur repair. Initial encounter.  EXAM: LEFT FEMUR - 2 VIEW; DG C-ARM 1-60 MIN - NRPT [facility]  COMPARISON:  Preoperative study 11/28/2013.  FLUOROSCOPY TIME:  1 minute and 4 seconds.  FINDINGS: Five intraoperative fluoroscopic views demonstrate left femur intra medullary rod placement with proximal interlocking dynamic hip screw. Near anatomic alignment about the comminuted intertrochanteric fracture.  IMPRESSION: Left proximal femur ORIF with no adverse features.   Electronically Signed   By: Augusto Gamble M.D.   On: 11/29/2013 03:36   Ct Head Wo Contrast  11/28/2013   CLINICAL DATA:  Pain post trauma  EXAM: CT HEAD WITHOUT CONTRAST  CT CERVICAL SPINE WITHOUT CONTRAST  TECHNIQUE: Multidetector CT imaging of the head and cervical spine was performed following the standard protocol without intravenous contrast. Multiplanar CT image reconstructions of the cervical spine were also generated.  COMPARISON:  None.  FINDINGS: CT HEAD FINDINGS  There is moderately severe diffuse atrophy. There is no mass, hemorrhage,  extra-axial fluid collection, or midline shift. There is small vessel disease throughout the centra semiovale bilaterally. No acute appearing infarct is seen, however.  Bony calvarium appears intact. The mastoid air cells are clear. There is debris in both external auditory canals.  CT CERVICAL SPINE FINDINGS  There is  no fracture. There is slight anterolisthesis of C3 on C4, felt to be due to spondylosis. There is no other appreciable spondylolisthesis. Prevertebral soft tissues and predental space regions are normal.  There is disc space narrowing, marked, at C4-5, C5-6, and C6-7. There is moderate disc space narrowing at C3-4 and C7-T1. There is slightly milder narrowing at C2-3. There is moderate facet hypertrophy at all levels bilaterally. There is no frank disc extrusion or stenosis appreciable.  There is a nodular lesion in the left lobe of the thyroid measuring 1.1 x 0.8 cm.  IMPRESSION: CT head: Extensive atrophy with small vessel disease throughout the centra semiovale bilaterally. There is no intracranial mass, hemorrhage, or acute appearing infarct. There is probable cerumen in both external auditory canals.  CT cervical spine: Multilevel osteoarthritic change. Slight spondylolisthesis is C3-4 is felt to be due to spondylosis. No fracture apparent.  There is a focal nodular lesion in the left lobe of the thyroid. Nonemergent thyroid ultrasound advised to further assess.   Electronically Signed   By: Bretta BangWilliam  Woodruff M.D.   On: 11/28/2013 15:26   Ct Cervical Spine Wo Contrast  11/28/2013   CLINICAL DATA:  Pain post trauma  EXAM: CT HEAD WITHOUT CONTRAST  CT CERVICAL SPINE WITHOUT CONTRAST  TECHNIQUE: Multidetector CT imaging of the head and cervical spine was performed following the standard protocol without intravenous contrast. Multiplanar CT image reconstructions of the cervical spine were also generated.  COMPARISON:  None.  FINDINGS: CT HEAD FINDINGS  There is moderately severe diffuse atrophy.  There is no mass, hemorrhage, extra-axial fluid collection, or midline shift. There is small vessel disease throughout the centra semiovale bilaterally. No acute appearing infarct is seen, however.  Bony calvarium appears intact. The mastoid air cells are clear. There is debris in both external auditory canals.  CT CERVICAL SPINE FINDINGS  There is no fracture. There is slight anterolisthesis of C3 on C4, felt to be due to spondylosis. There is no other appreciable spondylolisthesis. Prevertebral soft tissues and predental space regions are normal.  There is disc space narrowing, marked, at C4-5, C5-6, and C6-7. There is moderate disc space narrowing at C3-4 and C7-T1. There is slightly milder narrowing at C2-3. There is moderate facet hypertrophy at all levels bilaterally. There is no frank disc extrusion or stenosis appreciable.  There is a nodular lesion in the left lobe of the thyroid measuring 1.1 x 0.8 cm.  IMPRESSION: CT head: Extensive atrophy with small vessel disease throughout the centra semiovale bilaterally. There is no intracranial mass, hemorrhage, or acute appearing infarct. There is probable cerumen in both external auditory canals.  CT cervical spine: Multilevel osteoarthritic change. Slight spondylolisthesis is C3-4 is felt to be due to spondylosis. No fracture apparent.  There is a focal nodular lesion in the left lobe of the thyroid. Nonemergent thyroid ultrasound advised to further assess.   Electronically Signed   By: Bretta BangWilliam  Woodruff M.D.   On: 11/28/2013 15:26   Dg Pelvis Portable  11/29/2013   CLINICAL DATA:  79 year old male status post left femoral ORIF. Initial encounter.  EXAM: PORTABLE PELVIS 1-2 VIEWS  COMPARISON:  0323 hr the same day and earlier.  FINDINGS: Portable AP view at 0416 hrs. Partially visible left femur intra medullary rod with proximal interlocking dynamic hip screw. The distal aspect of the IM rod is not included. Near anatomic alignment of the left  intertrochanteric fracture. Overlying postoperative changes to the soft tissues. Visible pelvis appears stable.  IMPRESSION: Left femur  ORIF with no adverse features.   Electronically Signed   By: Augusto Gamble M.D.   On: 11/29/2013 04:50   Dg C-arm 1-60 Min-no Report  11/29/2013   CLINICAL DATA:  79 year old male undergoing left proximal femur repair. Initial encounter.  EXAM: LEFT FEMUR - 2 VIEW; DG C-ARM 1-60 MIN - NRPT [facility]  COMPARISON:  Preoperative study 11/28/2013.  FLUOROSCOPY TIME:  1 minute and 4 seconds.  FINDINGS: Five intraoperative fluoroscopic views demonstrate left femur intra medullary rod placement with proximal interlocking dynamic hip screw. Near anatomic alignment about the comminuted intertrochanteric fracture.  IMPRESSION: Left proximal femur ORIF with no adverse features.   Electronically Signed   By: Augusto Gamble M.D.   On: 11/29/2013 03:36    Scheduled Meds: . antiseptic oral rinse  15 mL Mouth Rinse BID  . aspirin EC  325 mg Oral Q breakfast  . citalopram  10 mg Oral Daily  . Dextromethorphan-Quinidine  1 capsule Oral BID  . docusate sodium  100 mg Oral BID  . doxazosin  4 mg Oral QHS  . finasteride  5 mg Oral Daily  . LORazepam  0.5 mg Oral BID  . pantoprazole  40 mg Oral Daily  . QUEtiapine  25 mg Oral Daily  . QUEtiapine  50 mg Oral QHS  . senna  1 tablet Oral BID  . simvastatin  10 mg Oral q1800   Continuous Infusions: . 0.9 % NaCl with KCl 20 mEq / L 75 mL/hr at 11/29/13 2025    Active Problems:   Closed left hip fracture   Alzheimer's dementia   Depression   Anxiety   HLD (hyperlipidemia)   BPH (benign prostatic hyperplasia)   Abnormal EKG   Hip fracture  Time spent:  Shiniqua Groseclose K  [facility] Pager [phone]. If 7PM-7AM, please contact night-coverage at [email or web address], password [facility] 11/30/2013, 9:23 AM  LOS: 2 days

## 2013-11-30 NOTE — Progress Notes (Signed)
Pt stable - wife thinks ativan dose may be too high Some pain with rom left hip Ready for snf asa for dvt pwb 50 percent

## 2013-12-01 ENCOUNTER — Encounter (HOSPITAL_COMMUNITY): Payer: Self-pay | Admitting: Orthopedic Surgery

## 2013-12-01 DIAGNOSIS — S72143A Displaced intertrochanteric fracture of unspecified femur, initial encounter for closed fracture: Principal | ICD-10-CM

## 2013-12-01 LAB — CBC
HEMATOCRIT: 28.1 % — AB (ref 39.0–52.0)
HEMOGLOBIN: 9.4 g/dL — AB (ref 13.0–17.0)
MCH: 31.5 pg (ref 26.0–34.0)
MCHC: 33.5 g/dL (ref 30.0–36.0)
MCV: 94.3 fL (ref 78.0–100.0)
PLATELETS: 137 10*3/uL — AB (ref 150–400)
RBC: 2.98 MIL/uL — AB (ref 4.22–5.81)
RDW: 12.7 % (ref 11.5–15.5)
WBC: 5.8 10*3/uL (ref 4.0–10.5)

## 2013-12-01 LAB — BASIC METABOLIC PANEL
BUN: 10 mg/dL (ref 6–23)
CHLORIDE: 106 meq/L (ref 96–112)
CO2: 29 meq/L (ref 19–32)
CREATININE: 0.71 mg/dL (ref 0.50–1.35)
Calcium: 8 mg/dL — ABNORMAL LOW (ref 8.4–10.5)
GFR calc Af Amer: >90 mL/min (ref >90)
GFR calc non Af Amer: 89 mL/min — ABNORMAL LOW (ref >90)
GLUCOSE: 113 mg/dL — AB (ref 70–99)
Potassium: 4.4 mEq/L (ref 3.7–5.3)
Sodium: 141 mEq/L (ref 137–147)

## 2013-12-01 LAB — PROTIME-INR
INR: 1.24 (ref 0.00–1.49)
PROTHROMBIN TIME: 15.3 s — AB (ref 11.6–15.2)

## 2013-12-01 NOTE — Progress Notes (Signed)
[facility] PROGRESS NOTE  [name] [name] [id number] DOB: 02-Sep-1935 DOA: 11/28/2013 PCP: [name], HENRY, MD  Assessment/Plan: Comminuted left intertrochanteric fracture  -Dr. Dorene GrebeScott Dean - Followed by Ortho, s/p surgery  2/7 - PT/OT - Stable thus far - Pending SNF Anxiety  -Continue Ativan as needed -Scheduled ativan held secondary to lethargy Depression  -Continue Celexa  -Continue Seroquel  BPH  -Proscar and Cardura  HLD  -Continue  HTN  -See BPH  Abnormal EKG  -Troponin neg x3  -proBNP unremarkable -TSH normal  Code Status: DNR Family Communication: Pt in room Disposition Plan: Pending   Consultants:  Orthopedic surgery  Procedures:  L intertrochantic fracture repair 11/29/13  HPI/Subjective: No events noted overnight. Pt without complaints.  Objective: Filed Vitals:   11/30/13 1600 11/30/13 2118 12/01/13 0203 12/01/13 0548  BP:  141/73 116/69 129/82  Pulse:  93 67 71  Temp:  99.3 F (37.4 C) 97.4 F (36.3 C) 98.3 F (36.8 C)  TempSrc:  Oral Oral Oral  Resp: 20 20 20 20   Height:      Weight:      SpO2:  100% 100% 100%    Intake/Output Summary (Last 24 hours) at 12/01/13 1001 Last data filed at 12/01/13 0548  Gross per 24 hour  Intake   1680 ml  Output    350 ml  Net   1330 ml   Filed Weights   11/29/13 0040 11/29/13 0501  Weight: 78.926 kg (174 lb) 81.2 kg (179 lb 0.2 oz)    Exam:   General:  Awake, in nad  Cardiovascular: regular, s1, s2  Respiratory: normal resp effort, no wheezing  Abdomen: soft, nondistended  Musculoskeletal: perfused,no clubbing   Data Reviewed: Basic Metabolic Panel:  Recent Labs Lab 11/28/13 2055 11/29/13 0510 11/30/13 0546 12/01/13 0335  NA 138 138 136* 141  K 3.7 4.2 3.9 4.4  CL 102 102 101 106  CO2 26 24 26 29   GLUCOSE 139* 130* 127* 113*  BUN 10 11 8 10   CREATININE 0.68 0.69 0.57 0.71  CALCIUM 8.6 8.2* 8.2* 8.0*  MG  --  2.0  --   --    Liver Function Tests:  Recent Labs Lab  11/29/13 0510  AST 23  ALT 15  ALKPHOS 79  BILITOT 0.8  PROT 5.9*  ALBUMIN 3.0*   No results found for this basename: LIPASE, AMYLASE,  in the last 168 hours No results found for this basename: AMMONIA,  in the last 168 hours CBC:  Recent Labs Lab 11/28/13 2055 11/29/13 0629 11/30/13 0546 12/01/13 0335  WBC 7.4 8.0 7.3 5.8  NEUTROABS 6.1 5.9  --   --   HGB 12.1* 11.0* 10.5* 9.4*  HCT 35.5* 33.2* 31.0* 28.1*  MCV 91.5 93.0 92.3 94.3  PLT 178 148* 153 137*   Cardiac Enzymes:  Recent Labs Lab 11/29/13 0019 11/29/13 0510 11/29/13 1120  TROPONINI <0.30 <0.30 <0.30   BNP (last 3 results)  Recent Labs  11/29/13 0019  PROBNP 281.9   CBG: No results found for this basename: GLUCAP,  in the last 168 hours  Recent Results (from the past 240 hour(s))  URINE CULTURE     Status: None   Collection Time    11/28/13  6:44 PM      Result Value Range Status   Specimen Description URINE, CLEAN CATCH   Final   Special Requests NONE   Final   Culture  Setup Time     Final   Value: 11/29/2013  00:42     Performed at [facility] Count     Final   Value: NO GROWTH     Performed at [facility]   Culture     Final   Value: NO GROWTH     Performed at [facility]   Report Status 11/29/2013 FINAL   Final  MRSA PCR SCREENING     Status: None   Collection Time    11/29/13  7:32 AM      Result Value Range Status   MRSA by PCR NEGATIVE  NEGATIVE Final   Comment:            The GeneXpert MRSA Assay (FDA     approved for NASAL specimens     only), is one component of a     comprehensive MRSA colonization     surveillance program. It is not     intended to diagnose MRSA     infection nor to guide or     monitor treatment for     MRSA infections.     Studies: No results found.  Scheduled Meds: . antiseptic oral rinse  15 mL Mouth Rinse BID  . aspirin EC  325 mg Oral Q breakfast  . citalopram  10 mg Oral Daily  .  Dextromethorphan-Quinidine  1 capsule Oral BID  . docusate sodium  100 mg Oral BID  . doxazosin  4 mg Oral QHS  . finasteride  5 mg Oral Daily  . pantoprazole  40 mg Oral Daily  . QUEtiapine  25 mg Oral Daily  . QUEtiapine  50 mg Oral QHS  . senna  1 tablet Oral BID  . simvastatin  10 mg Oral q1800   Continuous Infusions:    Active Problems:   Closed left hip fracture   Alzheimer's dementia   Depression   Anxiety   HLD (hyperlipidemia)   BPH (benign prostatic hyperplasia)   Abnormal EKG   Hip fracture  Time spent:  Kreg Earhart K  [facility] Pager [phone]. If 7PM-7AM, please contact night-coverage at [email or web address], password [facility] 12/01/2013, 10:01 AM  LOS: 3 days

## 2013-12-01 NOTE — Progress Notes (Signed)
Patient cleared for discharge. Packet copied and placed in [place]. CSW met with patient and patient's wife at bedside. Patient is confused. Wife thanked CSW for assistance and states that she would like patient to go via ambulance. ptar called for transportation.  Gricelda Foland C. [place] MSW, [place]

## 2013-12-01 NOTE — Care Management Note (Signed)
    Page 1 of 1   12/01/2013     1:04:31 PM   CARE MANAGEMENT NOTE 12/01/2013  Patient:  [name] [name],[name] [name]   Account Number:  [id number]  Date Initiated:  12/01/2013  Documentation initiated by:  [name] [name],[name] [name]  Subjective/Objective Assessment:   78 Y/O M ADMITTED W/L HIP FX.     Action/Plan:   FROM [place].   Anticipated DC Date:  12/01/2013   Anticipated DC Plan:  SKILLED NURSING FACILITY      DC Planning Services  CM consult      Choice offered to / List presented to:             Status of service:  Completed, signed off Medicare Important Message given?   (If response is "NO", the following Medicare IM given date fields will be blank) Date Medicare IM given:   Date Additional Medicare IM given:    Discharge Disposition:  SKILLED NURSING FACILITY  Per UR Regulation:  Reviewed for med. necessity/level of care/duration of stay  If discussed at Long Length of Stay Meetings, dates discussed:    Comments:  12/01/13 [name] Meskill RN,BSN NCM [phone] D/C SNF.

## 2013-12-01 NOTE — Progress Notes (Signed)
Clinical Social Work Department CLINICAL SOCIAL WORK PLACEMENT NOTE 12/01/2013  Patient:  [name] [name],[name] [name]  Account Number:  [id number] Admit date:  11/28/2013  Clinical Social Worker:  Doroteo GlassmanAMANDA SIMPSON, LCSWA  Date/time:  11/30/2013 03:24 PM  Clinical Social Work is seeking post-discharge placement for this patient at the following level of care:   SKILLED NURSING   (*CSW will update this form in Epic as items are completed)   11/30/2013  Patient/family provided with [facility] Work's list of facilities offering this level of care within the geographic area requested by the patient (or if unable, by the patient's family).  11/30/2013  Patient/family informed of their [name] [name] to choose among providers that offer the needed level of care, that participate in Medicare, Medicaid or managed care program needed by the patient, have an available bed and are willing to accept the patient.  11/30/2013  Patient/family informed of [facility] ownership interest in [facility], as well as of the fact that they are under no obligation to receive care at this facility.  PASARR submitted to EDS on 11/30/2013 PASARR number received from EDS on 11/30/2013  FL2 transmitted to all facilities in geographic area requested by pt/family on  11/30/2013 FL2 transmitted to all facilities within larger geographic area on   Patient informed that his/her managed care company has contracts with or will negotiate with  certain facilities, including the following:     Patient/family informed of bed offers received:  12/01/2013 [facility] [place] Physician recommends and patient chooses bed at    [facility] [place] on  12/01/2013 Patient to be transferred to facility by ptar  The following physician request were entered in Epic:   Additional Comments:

## 2013-12-01 NOTE — Discharge Summary (Signed)
Physician Discharge Summary  [name] [name] ZOX:[id number] DOB: November 16, 1934 DOA: 11/28/2013  PCP: Florentina Jenny, MD  Admit date: 11/28/2013 Discharge date: 12/01/2013  Time spent: 35 minutes  Recommendations for Outpatient Follow-up:  1. Follow up with PCP in 1-2 weeks 2. Follow up with Orthopedic surgery (Dr. Cammy Copa) as scheduled  Discharge Diagnoses:  Active Problems:   Closed left hip fracture   Alzheimer's dementia   Depression   Anxiety   HLD (hyperlipidemia)   BPH (benign prostatic hyperplasia)   Abnormal EKG   Hip fracture   Discharge Condition: Improved  Diet recommendation: Regular  Filed Weights   11/29/13 0040 11/29/13 0501  Weight: 78.926 kg (174 lb) 81.2 kg (179 lb 0.2 oz)    History of present illness:  [name] [name] is a 79 y.o. WM PMHx depression, dementia, Alzheimer disease, anxiety, HLD, BPH  Presented from his nursing facility with an unwitnessed fall which occurred earlier today. The patient was found down. The facility he notes that he had some bruising on his left forearm and was unable to put weight on his left leg. The wife is at the bedside and states that the patient has otherwise been well and is at baseline. They recently moved to the area from [place]. He is afebrile, mildly hypertensive, vital signs otherwise unremarkable. He has a history of dementia and is mildly agitated on exam which the wife states is normal. Will give Ativan and get screening imaging. The patient is difficult to examine due to agitation and confused responses to questioning. Will give tylenol, ativan, and get screening imaging. Per Dr. Purvis Sheffield (ED) have spoken with Dr. Dorene Grebe (orthopedic surgeon) who plans on taking patient to surgery tonight.  Hospital Course:  Comminuted left intertrochanteric fracture  -Dr. Dorene Grebe  - Followed by Ortho, s/p surgery 2/7  - PT/OT  - Remained stable thus far  - For SNF  Anxiety  -Continued Ativan as needed   -Scheduled ativan was held secondary to lethargy  Depression  -Continue Celexa  -Continue Seroquel  BPH  -Proscar and Cardura  HLD  -Continue  HTN  -See BPH  Abnormal EKG  -Troponin neg x3  -proBNP unremarkable  -TSH normal  DVT prophylaxis on discharge: Aspirin  Procedures:   (i.e. Studies not automatically included, echos, thoracentesis, etc; not x-rays)  Consultations:  Orthopedic surgery  Discharge Exam: Filed Vitals:   11/30/13 1600 11/30/13 2118 12/01/13 0203 12/01/13 0548  BP:  141/73 116/69 129/82  Pulse:  93 67 71  Temp:  99.3 F (37.4 C) 97.4 F (36.3 C) 98.3 F (36.8 C)  TempSrc:  Oral Oral Oral  Resp: 20 20 20 20   Height:      Weight:      SpO2:  100% 100% 100%    General: Awake, in nad Cardiovascular: regular, s1, s2 Respiratory: normal resp effort, no wheezing  Discharge Instructions      Discharge Orders   Future Orders Complete By Expires   Partial weight bearing  As directed    Questions:     % Body Weight:     Laterality:     Extremity:         Medication List    STOP taking these medications       aspirin 81 MG tablet  Replaced by:  aspirin EC 325 MG tablet     LORazepam 0.5 MG tablet  Commonly known as:  ATIVAN      TAKE these medications  aspirin EC 325 MG tablet  Take 1 tablet (325 mg total) by mouth daily.     citalopram 10 MG tablet  Commonly known as:  CELEXA  Take 10 mg by mouth daily.     doxazosin 4 MG tablet  Commonly known as:  CARDURA  Take 4 mg by mouth at bedtime.     finasteride 5 MG tablet  Commonly known as:  PROSCAR  Take 5 mg by mouth daily.     HYDROcodone-acetaminophen 5-325 MG per tablet  Commonly known as:  NORCO  Take 1-2 tablets by mouth every 6 (six) hours as needed for moderate pain.     metroNIDAZOLE 0.75 % gel  Commonly known as:  METROGEL  Apply 1 application topically 2 (two) times daily. Apply to rash on face     NUEDEXTA 20-10 MG Caps  Generic drug:   Dextromethorphan-Quinidine  Take 1 capsule by mouth 2 (two) times daily.     omeprazole 20 MG capsule  Commonly known as:  PRILOSEC  Take 20 mg by mouth daily.     pravastatin 20 MG tablet  Commonly known as:  PRAVACHOL  Take 20 mg by mouth at bedtime.     QUEtiapine 25 MG tablet  Commonly known as:  SEROQUEL  Take 25 mg by mouth daily.     QUEtiapine 50 MG tablet  Commonly known as:  SEROQUEL  Take 50 mg by mouth at bedtime.       No Known Allergies Follow-up Information   Follow up with Florentina Jenny, MD. Schedule an appointment as soon as possible for a visit in 2 weeks.   Specialty:  Family Medicine   Contact information:   [address] [place] [address] [phone]       Follow up with Cammy Copa, MD. (follow up as scheduled)    Specialty:  Orthopedic Surgery   Contact information:   [address] [place] [address] [place] [address] [phone]        The results of significant diagnostics from this hospitalization (including imaging, microbiology, ancillary and laboratory) are listed below for reference.    Significant Diagnostic Studies: Dg Chest 2 View  11/28/2013   CLINICAL DATA:  Larey Seat.  History of hypertension.  EXAM: CHEST  2 VIEW  COMPARISON:  None.  FINDINGS: Poor inspiration. Mildly enlarged cardiac silhouette. Clear lungs. Thoracic spine degenerative changes. No fracture or pneumothorax seen.  IMPRESSION: No acute abnormality.   Electronically Signed   By: Gordan Payment M.D.   On: 11/28/2013 19:39   Dg Forearm Left  11/28/2013   CLINICAL DATA:  Left forearm pain following injury  EXAM: LEFT FOREARM - 2 VIEW  COMPARISON:  None.  FINDINGS: There is no evidence of fracture or other focal bone lesions. Soft tissues are unremarkable.  IMPRESSION: No acute abnormality noted.   Electronically Signed   By: Alcide Clever M.D.   On: 11/28/2013 19:39   Dg Hip Complete Left  11/28/2013   CLINICAL DATA:  Left hip pain following a fall.  EXAM: LEFT  HIP - COMPLETE 2+ VIEW  COMPARISON:  None.  FINDINGS: Comminuted left intertrochanteric fracture with varus angulation. There is also posterior angulation and 1/2 shaft width of anterior displacement of the distal fragment.  IMPRESSION: Comminuted left intertrochanteric fracture, as described above.   Electronically Signed   By: Gordan Payment M.D.   On: 11/28/2013 19:40   Dg Femur Left  11/29/2013   CLINICAL DATA:  79 year old male undergoing  left proximal femur repair. Initial encounter.  EXAM: LEFT FEMUR - 2 VIEW; DG C-ARM 1-60 MIN - NRPT [facility]  COMPARISON:  Preoperative study 11/28/2013.  FLUOROSCOPY TIME:  1 minute and 4 seconds.  FINDINGS: Five intraoperative fluoroscopic views demonstrate left femur intra medullary rod placement with proximal interlocking dynamic hip screw. Near anatomic alignment about the comminuted intertrochanteric fracture.  IMPRESSION: Left proximal femur ORIF with no adverse features.   Electronically Signed   By: Augusto Gamble M.D.   On: 11/29/2013 03:36   Ct Head Wo Contrast  11/28/2013   CLINICAL DATA:  Pain post trauma  EXAM: CT HEAD WITHOUT CONTRAST  CT CERVICAL SPINE WITHOUT CONTRAST  TECHNIQUE: Multidetector CT imaging of the head and cervical spine was performed following the standard protocol without intravenous contrast. Multiplanar CT image reconstructions of the cervical spine were also generated.  COMPARISON:  None.  FINDINGS: CT HEAD FINDINGS  There is moderately severe diffuse atrophy. There is no mass, hemorrhage, extra-axial fluid collection, or midline shift. There is small vessel disease throughout the centra semiovale bilaterally. No acute appearing infarct is seen, however.  Bony calvarium appears intact. The mastoid air cells are clear. There is debris in both external auditory canals.  CT CERVICAL SPINE FINDINGS  There is no fracture. There is slight anterolisthesis of C3 on C4, felt to be due to spondylosis. There is no other appreciable spondylolisthesis.  Prevertebral soft tissues and predental space regions are normal.  There is disc space narrowing, marked, at C4-5, C5-6, and C6-7. There is moderate disc space narrowing at C3-4 and C7-T1. There is slightly milder narrowing at C2-3. There is moderate facet hypertrophy at all levels bilaterally. There is no frank disc extrusion or stenosis appreciable.  There is a nodular lesion in the left lobe of the thyroid measuring 1.1 x 0.8 cm.  IMPRESSION: CT head: Extensive atrophy with small vessel disease throughout the centra semiovale bilaterally. There is no intracranial mass, hemorrhage, or acute appearing infarct. There is probable cerumen in both external auditory canals.  CT cervical spine: Multilevel osteoarthritic change. Slight spondylolisthesis is C3-4 is felt to be due to spondylosis. No fracture apparent.  There is a focal nodular lesion in the left lobe of the thyroid. Nonemergent thyroid ultrasound advised to further assess.   Electronically Signed   By: Bretta Bang M.D.   On: 11/28/2013 15:26   Ct Cervical Spine Wo Contrast  11/28/2013   CLINICAL DATA:  Pain post trauma  EXAM: CT HEAD WITHOUT CONTRAST  CT CERVICAL SPINE WITHOUT CONTRAST  TECHNIQUE: Multidetector CT imaging of the head and cervical spine was performed following the standard protocol without intravenous contrast. Multiplanar CT image reconstructions of the cervical spine were also generated.  COMPARISON:  None.  FINDINGS: CT HEAD FINDINGS  There is moderately severe diffuse atrophy. There is no mass, hemorrhage, extra-axial fluid collection, or midline shift. There is small vessel disease throughout the centra semiovale bilaterally. No acute appearing infarct is seen, however.  Bony calvarium appears intact. The mastoid air cells are clear. There is debris in both external auditory canals.  CT CERVICAL SPINE FINDINGS  There is no fracture. There is slight anterolisthesis of C3 on C4, felt to be due to spondylosis. There is no other  appreciable spondylolisthesis. Prevertebral soft tissues and predental space regions are normal.  There is disc space narrowing, marked, at C4-5, C5-6, and C6-7. There is moderate disc space narrowing at C3-4 and C7-T1. There is slightly milder narrowing at C2-3. There  is moderate facet hypertrophy at all levels bilaterally. There is no frank disc extrusion or stenosis appreciable.  There is a nodular lesion in the left lobe of the thyroid measuring 1.1 x 0.8 cm.  IMPRESSION: CT head: Extensive atrophy with small vessel disease throughout the centra semiovale bilaterally. There is no intracranial mass, hemorrhage, or acute appearing infarct. There is probable cerumen in both external auditory canals.  CT cervical spine: Multilevel osteoarthritic change. Slight spondylolisthesis is C3-4 is felt to be due to spondylosis. No fracture apparent.  There is a focal nodular lesion in the left lobe of the thyroid. Nonemergent thyroid ultrasound advised to further assess.   Electronically Signed   By: Bretta Bang M.D.   On: 11/28/2013 15:26   Dg Pelvis Portable  11/29/2013   CLINICAL DATA:  79 year old male status post left femoral ORIF. Initial encounter.  EXAM: PORTABLE PELVIS 1-2 VIEWS  COMPARISON:  0323 hr the same day and earlier.  FINDINGS: Portable AP view at 0416 hrs. Partially visible left femur intra medullary rod with proximal interlocking dynamic hip screw. The distal aspect of the IM rod is not included. Near anatomic alignment of the left intertrochanteric fracture. Overlying postoperative changes to the soft tissues. Visible pelvis appears stable.  IMPRESSION: Left femur ORIF with no adverse features.   Electronically Signed   By: Augusto Gamble M.D.   On: 11/29/2013 04:50   Dg C-arm 1-60 Min-no Report  11/29/2013   CLINICAL DATA:  79 year old male undergoing left proximal femur repair. Initial encounter.  EXAM: LEFT FEMUR - 2 VIEW; DG C-ARM 1-60 MIN - NRPT [facility]  COMPARISON:  Preoperative study 11/28/2013.   FLUOROSCOPY TIME:  1 minute and 4 seconds.  FINDINGS: Five intraoperative fluoroscopic views demonstrate left femur intra medullary rod placement with proximal interlocking dynamic hip screw. Near anatomic alignment about the comminuted intertrochanteric fracture.  IMPRESSION: Left proximal femur ORIF with no adverse features.   Electronically Signed   By: Augusto Gamble M.D.   On: 11/29/2013 03:36    Microbiology: Recent Results (from the past 240 hour(s))  URINE CULTURE     Status: None   Collection Time    11/28/13  6:44 PM      Result Value Range Status   Specimen Description URINE, CLEAN CATCH   Final   Special Requests NONE   Final   Culture  Setup Time     Final   Value: 11/29/2013 00:42     Performed at [facility] Count     Final   Value: NO GROWTH     Performed at [facility]   Culture     Final   Value: NO GROWTH     Performed at [facility]   Report Status 11/29/2013 FINAL   Final  MRSA PCR SCREENING     Status: None   Collection Time    11/29/13  7:32 AM      Result Value Range Status   MRSA by PCR NEGATIVE  NEGATIVE Final   Comment:            The GeneXpert MRSA Assay (FDA     approved for NASAL specimens     only), is one component of a     comprehensive MRSA colonization     surveillance program. It is not     intended to diagnose MRSA     infection nor to guide or     monitor treatment for  MRSA infections.     Labs: Basic Metabolic Panel:  Recent Labs Lab 11/28/13 2055 11/29/13 0510 11/30/13 0546 12/01/13 0335  NA 138 138 136* 141  K 3.7 4.2 3.9 4.4  CL 102 102 101 106  CO2 26 24 26 29   GLUCOSE 139* 130* 127* 113*  BUN 10 11 8 10   CREATININE 0.68 0.69 0.57 0.71  CALCIUM 8.6 8.2* 8.2* 8.0*  MG  --  2.0  --   --    Liver Function Tests:  Recent Labs Lab 11/29/13 0510  AST 23  ALT 15  ALKPHOS 79  BILITOT 0.8  PROT 5.9*  ALBUMIN 3.0*   No results found for this basename: LIPASE, AMYLASE,  in the last  168 hours No results found for this basename: AMMONIA,  in the last 168 hours CBC:  Recent Labs Lab 11/28/13 2055 11/29/13 0629 11/30/13 0546 12/01/13 0335  WBC 7.4 8.0 7.3 5.8  NEUTROABS 6.1 5.9  --   --   HGB 12.1* 11.0* 10.5* 9.4*  HCT 35.5* 33.2* 31.0* 28.1*  MCV 91.5 93.0 92.3 94.3  PLT 178 148* 153 137*   Cardiac Enzymes:  Recent Labs Lab 11/29/13 0019 11/29/13 0510 11/29/13 1120  TROPONINI <0.30 <0.30 <0.30   BNP: BNP (last 3 results)  Recent Labs  11/29/13 0019  PROBNP 281.9   CBG: No results found for this basename: GLUCAP,  in the last 168 hours   Signed:  Sung Parodi K  [facility] 12/01/2013, 12:32 PM

## 2013-12-01 NOTE — Progress Notes (Signed)
Physical Therapy Treatment Patient Details Name: [name] [name] MRN: [id number] DOB: September 30, 1935 Today's Date: 12/01/2013 Time: [id number] PT Time Calculation (min): 23 min  PT Assessment / Plan / Recommendation  History of Present Illness HPI: [name] [name] is a 79 y.o. WM PMHx depression, dementia, Alzheimer disease, anxiety, HLD, BPH, who fell at facility and sustained L IT hip fx, s/p IM nail on 11/28/13(late surgery) Pt is from a skilled facility.   PT Comments   Able to work on bed mobility and sitting balance at EOB this session. Progressing slowly with mobility however pt is limited by cognition, pain. Recommend SNF for continued rehab.   Follow Up Recommendations  SNF;Supervision/Assistance - 24 hour     Does the patient have the potential to tolerate intense rehabilitation     Barriers to Discharge        Equipment Recommendations  None recommended by PT    Recommendations for Other Services    Frequency Min 3X/week   Progress towards PT Goals Progress towards PT goals: Progressing toward goals (very slowly-limited by cognition, pain)  Plan Current plan remains appropriate    Precautions / Restrictions Precautions Precautions: Fall Restrictions Weight Bearing Restrictions: Yes LLE Weight Bearing: Partial weight bearing LLE Partial Weight Bearing Percentage or Pounds: 25-50%   Pertinent Vitals/Pain Pt yells out briefly with rolling, movement of L LE.     Mobility  Bed Mobility Overal bed mobility: Needs Assistance Bed Mobility: Supine to Sit;Sit to Supine Rolling: Total assist;+2 for physical assistance;+2 for safety/equipment Supine to sit: Total assist;+2 for physical assistance;+2 for safety/equipment Sit to supine: Total assist;+2 for physical assistance;+2 for safety/equipment General bed mobility comments: Assist for bil LEs and trunk. Utilized bedpad for scooting, positioning. Pt did not initiate task. Rolling towards L side for linen exchange due to pt  incontinent of urine-noted condem cath not in place at start of session-nursing made aware.    Exercises     PT Diagnosis:    PT Problem List:   PT Treatment Interventions:     PT Goals (current goals can now be found in the care plan section)    Visit Information  Last PT Received On: 12/01/13 Assistance Needed: +2 History of Present Illness: HPI: [name] [name] is a 79 y.o. WM PMHx depression, dementia, Alzheimer disease, anxiety, HLD, BPH, who fell at facility and sustained L IT hip fx, s/p IM nail on 11/28/13(late surgery) Pt is from a skilled facility.    Subjective Data      Cognition  Cognition Arousal/Alertness: Awake/alert Behavior During Therapy: Flat affect (mildly agitated at times. ) Overall Cognitive Status: No family/caregiver present to determine baseline cognitive functioning    Balance  Balance Overall balance assessment: History of Falls;Needs assistance Sitting-balance support: No upper extremity supported;Feet supported;Bilateral upper extremity supported Sitting balance - Comments: Initially required Max assist for static sitting balance-progressed to Min assist with time. Sat EOB  at least 10 minutes.   End of Session PT - End of Session Activity Tolerance: Patient limited by pain (Limited by cognition) Patient left: in bed;with call bell/phone within reach;with bed alarm set Nurse Communication: Other (comment) (linen changed, condom cath not in place)   GP     [name] Staff, MPT Pager: [phone]

## 2013-12-01 NOTE — Progress Notes (Signed)
CSW spoke with patient's spouse. Gave bed offers. She is anxious about choosing a bed as they only moved here three days ago when this happened but her daughter has been doing research and she would like [facility].  Brondon Wann C. Mckoy Bhakta MSW, LCSW [phone]

## 2013-12-01 NOTE — Progress Notes (Signed)
Report given to Lawson FiscalLori, Charity fundraiserN at [facility].  Patient ready for discharge, transport has been called.

## 2013-12-01 NOTE — Op Note (Signed)
NAME:  [name], [name]             ACCOUNT NO.:  [id number]  MEDICAL RECORD NO.:  [id number]  LOCATION:                                 FACILITY:  PHYSICIAN:  Burnard BuntingG. Scott Cornelis Kluver, M.D.    DATE OF BIRTH:  [birth year]  DATE OF PROCEDURE: DATE OF DISCHARGE:                              OPERATIVE REPORT   PREOPERATIVE DIAGNOSIS:  Left hip intertrochanteric fracture.  POSTOPERATIVE DIAGNOSIS:  Left hip intertrochanteric fracture.  PROCEDURE:  Left hip intertrochanteric fracture, open reduction and internal fixation.  SURGEON:  Burnard BuntingG. Scott Frieda Arnall, M.D.  ASSISTANT:  None.  ANESTHESIA:  General endotracheal.  ESTIMATED BLOOD LOSS:  50 mL.  INDICATIONS:  [name] [name] is a patient with left hip fracture, presents for operative management.  He has Alzheimer's, but he does ambulate some.  COMPONENTS UTILIZED:  [employer] 38 x 10-mm nail with 90-mm compression/lag screw.  PROCEDURE IN DETAIL:  The patient was brought to the operating room where general endotracheal anesthesia was induced.  Preoperative antibiotics were administered.  Time-out was called.  The patient was placed in the fracture table with the right leg in lithotomy position, left leg under traction and slight internal rotation.  Fracture reduction was achieved under fluoroscopy on the left side.  Time-out was called.  The area was then prescrubbed with alcohol and Betadine, which was allowed to dry.  Prepped with DuraPrep solution and draped in sterile manner.  Under fluoroscopic guidance, guidepin was placed through the tip of the trochanter into the proximal femoral shaft. Overreaming was performed in accordance with preoperative templating and 10 x 38-mm nail was placed.  Separate incision was used to place a lag screw across the fracture site.  Tip-apex distance less than or equal to 25 mm in the AP and lateral.  Screw was in the center-center position. Good bone quality was present.  Fluoroscopy was used to confirm  good reduction.  Compression screw was placed through the lag screw for adequate compression across the fracture site.  The jig was removed.  All incisions were then irrigated and closed using interrupted inverted 0 Vicryl suture, 2-0 Vicryl suture and skin staples.  Dressing was applied.  The patient tolerated the procedure well without immediate complications.  Spinal anesthetic was utilized.     Burnard BuntingG. Scott Cydni Reddoch, M.D.     GSD/MEDQ  D:  11/29/2013  T:  11/30/2013  Job:  [id number]

## 2015-03-16 LAB — BASIC METABOLIC PANEL
Creatinine: 0.7 mg/dL (ref <=1.3)
Glucose: 88 mg/dL
SODIUM: 137 mmol/L (ref 137–147)

## 2015-03-16 LAB — CBC AND DIFFERENTIAL
HEMOGLOBIN: 13.1 g/dL — AB (ref 13.5–17.5)
PLATELETS: 189 10*3/uL (ref 150–399)
WBC: 4.2 10^3/mL

## 2015-06-23 ENCOUNTER — Encounter: Payer: Self-pay | Admitting: *Deleted

## 2015-06-24 ENCOUNTER — Encounter: Payer: Self-pay | Admitting: Internal Medicine

## 2015-06-24 ENCOUNTER — Non-Acute Institutional Stay (SKILLED_NURSING_FACILITY): Payer: Medicare Other | Admitting: Internal Medicine

## 2015-06-24 DIAGNOSIS — G309 Alzheimer's disease, unspecified: Secondary | ICD-10-CM

## 2015-06-24 DIAGNOSIS — F322 Major depressive disorder, single episode, severe without psychotic features: Secondary | ICD-10-CM | POA: Diagnosis not present

## 2015-06-24 DIAGNOSIS — L731 Pseudofolliculitis barbae: Secondary | ICD-10-CM

## 2015-06-24 DIAGNOSIS — R05 Cough: Secondary | ICD-10-CM | POA: Diagnosis not present

## 2015-06-24 DIAGNOSIS — R21 Rash and other nonspecific skin eruption: Secondary | ICD-10-CM | POA: Diagnosis not present

## 2015-06-24 DIAGNOSIS — R609 Edema, unspecified: Secondary | ICD-10-CM | POA: Diagnosis not present

## 2015-06-24 DIAGNOSIS — K219 Gastro-esophageal reflux disease without esophagitis: Secondary | ICD-10-CM | POA: Diagnosis not present

## 2015-06-24 DIAGNOSIS — N400 Enlarged prostate without lower urinary tract symptoms: Secondary | ICD-10-CM | POA: Diagnosis not present

## 2015-06-24 DIAGNOSIS — R059 Cough, unspecified: Secondary | ICD-10-CM

## 2015-06-24 DIAGNOSIS — F411 Generalized anxiety disorder: Secondary | ICD-10-CM

## 2015-06-24 DIAGNOSIS — L739 Follicular disorder, unspecified: Secondary | ICD-10-CM

## 2015-06-24 DIAGNOSIS — F0280 Dementia in other diseases classified elsewhere without behavioral disturbance: Secondary | ICD-10-CM

## 2015-06-24 DIAGNOSIS — F329 Major depressive disorder, single episode, unspecified: Secondary | ICD-10-CM

## 2015-06-24 NOTE — Progress Notes (Signed)
Patient ID: Marlowe Turnipseed, Pepe Mineau 02-10-1935, 80 y.o.   MRN: [id number]    [facility]  PCP: Florentina Jenny, MD  Code Status: DNR  No Known Allergies  Chief Complaint  Patient presents with  . New Admit To SNF    New Admission      HPI:  80 y.o. patient is here for long term care. He was residing at [facility] before this. He is under total care and needs hoyer transfer. He has advanced dementia with Alzhimer's disease, depression, CVA, HLD, BPH among others. His wife is present in the room. He is unable to participate in HPI or ROS. Wife has several concerns. She had noticed redness in his groin area and his bottom a week back and is concerned about this. She has also noticed for him to be coughing recently. He has been more sleepy this am  Review of Systems: from wife Constitutional: Negative for fever, chills, diaphoresis.  HENT: Negative for headache, congestion, nasal discharge Eyes: Negative for eye pain, blurred vision, double vision and discharge.  Respiratory: Negative for shortness of breath and wheezing.   Cardiovascular: Negative for chest pain, palpitations, leg swelling.  Gastrointestinal: Negative for nausea, vomiting, abdominal pain. Has urinary incontinece  Musculoskeletal: Negative for back pain, falls Psychiatric/Behavioral: positive for memory loss.    Past Medical History  Diagnosis Date  . Dementia   . Hypertension   . Alzheimer disease    Past Surgical History  Procedure Laterality Date  . Intramedullary (im) nail intertrochanteric Left 11/29/2013    Procedure: INTRAMEDULLARY (IM) NAIL INTERTROCHANTRIC;  Surgeon: Cammy Copa, MD;  Location: [facility];  Service: Orthopedics;  Laterality: Left;   Social History:   reports that he has never smoked. He does not have any smokeless tobacco history on file. He reports that he does not drink alcohol or use illicit drugs.  No family history on file.  Medications:   Medication List         This list is accurate as of: 06/24/15 11:59 PM.  Always use your most recent med list.               acetaminophen 650 MG CR tablet  Commonly known as:  TYLENOL  Take 650 mg by mouth every 4 (four) hours as needed for pain.     ALPRAZolam 0.25 MG tablet  Commonly known as:  XANAX  Take 0.25 mg by mouth 3 (three) times daily. For mood disorder     ALPRAZolam 1 MG tablet  Commonly known as:  XANAX  Take 1 mg by mouth every 4 (four) hours as needed for anxiety.     aspirin EC 325 MG tablet  Take 1 tablet (325 mg total) by mouth daily.     citalopram 10 MG tablet  Commonly known as:  CELEXA  Take 10 mg by mouth daily. For depression     divalproex 125 MG capsule  Commonly known as:  DEPAKOTE SPRINKLE  Take 125 mg by mouth daily. For psychosis     doxazosin 4 MG tablet  Commonly known as:  CARDURA  Take 4 mg by mouth at bedtime.     finasteride 5 MG tablet  Commonly known as:  PROSCAR  Take 5 mg by mouth daily.     fluconazole 150 MG tablet  Commonly known as:  DIFLUCAN  Take 150 mg by mouth every other day.     furosemide 20 MG tablet  Commonly known as:  LASIX  Take 20 mg by mouth daily as needed for edema.     metroNIDAZOLE 0.75 % gel  Commonly known as:  METROGEL  Apply 1 application topically 2 (two) times daily. Apply to rash on face     NON FORMULARY  Ativan gel apply topically every 4 hours as needed for anxiety or agitation.     omeprazole 20 MG capsule  Commonly known as:  PRILOSEC  Take 20 mg by mouth daily.     QUEtiapine 25 MG tablet  Commonly known as:  SEROQUEL  Take 75 mg by mouth at bedtime.         Physical Exam: Filed Vitals:   06/24/15 1202  BP: 116/78  Pulse: 70  Temp: 97.4 F (36.3 C)  TempSrc: Oral  Resp: 20  Height: 5\' 9"  (1.753 m)  Weight: 180 lb (81.647 kg)  SpO2: 98%    General- elderly male, frail and thin built, in no acute distress Head- normocephalic, atraumatic Nose- normal nasal mucosa, no maxillary or  frontal sinus tenderness, no nasal discharge Throat- moist mucus membrane Eyes- no pallor, no icterus, no discharge, normal conjunctiva, normal sclera Neck- no cervical lymphadenopathy Cardiovascular- normal s1,s2, no murmurs Respiratory-poor air entry, rhonchi present, no wheeze or crackles, no use of accessory muscles Abdomen- bowel sounds present, soft, non tender Musculoskeletal- generalized weakness, trace edema, recliner chair, hoyer transfer Neurological- follows some simple command Skin- warm and dry, redness to groin area with folliculitis present, mild erythema in buttock area which is blanchable   Labs reviewed: Basic Metabolic Panel:  Recent Labs  96/04/54  NA 137  CREATININE 0.7   CBC:  Recent Labs  03/16/15  WBC 4.2  HGB 13.1*  PLT 189   Radiological Exams: Dg Chest 2 View  11/28/2013   CLINICAL DATA:  Larey Seat.  History of hypertension.  EXAM: CHEST  2 VIEW  COMPARISON:  None.  FINDINGS: Poor inspiration. Mildly enlarged cardiac silhouette. Clear lungs. Thoracic spine degenerative changes. No fracture or pneumothorax seen.  IMPRESSION: No acute abnormality.   Electronically Signed   By: Gordan Payment M.D.   On: 11/28/2013 19:39   Dg Forearm Left  11/28/2013   CLINICAL DATA:  Left forearm pain following injury  EXAM: LEFT FOREARM - 2 VIEW  COMPARISON:  None.  FINDINGS: There is no evidence of fracture or other focal bone lesions. Soft tissues are unremarkable.  IMPRESSION: No acute abnormality noted.   Electronically Signed   By: Alcide Clever M.D.   On: 11/28/2013 19:39   Dg Hip Complete Left  11/28/2013   CLINICAL DATA:  Left hip pain following a fall.  EXAM: LEFT HIP - COMPLETE 2+ VIEW  COMPARISON:  None.  FINDINGS: Comminuted left intertrochanteric fracture with varus angulation. There is also posterior angulation and 1/2 shaft width of anterior displacement of the distal fragment.  IMPRESSION: Comminuted left intertrochanteric fracture, as described above.    Electronically Signed   By: Gordan Payment M.D.   On: 11/28/2013 19:40   Dg Femur Left  11/29/2013   CLINICAL DATA:  80 year old male undergoing left proximal femur repair. Initial encounter.  EXAM: LEFT FEMUR - 2 VIEW; DG C-ARM 1-60 MIN - NRPT [facility]  COMPARISON:  Preoperative study 11/28/2013.  FLUOROSCOPY TIME:  1 minute and 4 seconds.  FINDINGS: Five intraoperative fluoroscopic views demonstrate left femur intra medullary rod placement with proximal interlocking dynamic hip screw. Near anatomic alignment about the comminuted intertrochanteric fracture.  IMPRESSION: Left proximal femur ORIF with no  adverse features.   Electronically Signed   By: Augusto Gamble M.D.   On: 11/29/2013 03:36   Ct Head Wo Contrast  11/28/2013   CLINICAL DATA:  Pain post trauma  EXAM: CT HEAD WITHOUT CONTRAST  CT CERVICAL SPINE WITHOUT CONTRAST  TECHNIQUE: Multidetector CT imaging of the head and cervical spine was performed following the standard protocol without intravenous contrast. Multiplanar CT image reconstructions of the cervical spine were also generated.  COMPARISON:  None.  FINDINGS: CT HEAD FINDINGS  There is moderately severe diffuse atrophy. There is no mass, hemorrhage, extra-axial fluid collection, or midline shift. There is small vessel disease throughout the centra semiovale bilaterally. No acute appearing infarct is seen, however.  Bony calvarium appears intact. The mastoid air cells are clear. There is debris in both external auditory canals.  CT CERVICAL SPINE FINDINGS  There is no fracture. There is slight anterolisthesis of C3 on C4, felt to be due to spondylosis. There is no other appreciable spondylolisthesis. Prevertebral soft tissues and predental space regions are normal.  There is disc space narrowing, marked, at C4-5, C5-6, and C6-7. There is moderate disc space narrowing at C3-4 and C7-T1. There is slightly milder narrowing at C2-3. There is moderate facet hypertrophy at all levels bilaterally. There is no  frank disc extrusion or stenosis appreciable.  There is a nodular lesion in the left lobe of the thyroid measuring 1.1 x 0.8 cm.  IMPRESSION: CT head: Extensive atrophy with small vessel disease throughout the centra semiovale bilaterally. There is no intracranial mass, hemorrhage, or acute appearing infarct. There is probable cerumen in both external auditory canals.  CT cervical spine: Multilevel osteoarthritic change. Slight spondylolisthesis is C3-4 is felt to be due to spondylosis. No fracture apparent.  There is a focal nodular lesion in the left lobe of the thyroid. Nonemergent thyroid ultrasound advised to further assess.   Electronically Signed   By: Bretta Bang M.D.   On: 11/28/2013 15:26   Ct Cervical Spine Wo Contrast  11/28/2013   CLINICAL DATA:  Pain post trauma  EXAM: CT HEAD WITHOUT CONTRAST  CT CERVICAL SPINE WITHOUT CONTRAST  TECHNIQUE: Multidetector CT imaging of the head and cervical spine was performed following the standard protocol without intravenous contrast. Multiplanar CT image reconstructions of the cervical spine were also generated.  COMPARISON:  None.  FINDINGS: CT HEAD FINDINGS  There is moderately severe diffuse atrophy. There is no mass, hemorrhage, extra-axial fluid collection, or midline shift. There is small vessel disease throughout the centra semiovale bilaterally. No acute appearing infarct is seen, however.  Bony calvarium appears intact. The mastoid air cells are clear. There is debris in both external auditory canals.  CT CERVICAL SPINE FINDINGS  There is no fracture. There is slight anterolisthesis of C3 on C4, felt to be due to spondylosis. There is no other appreciable spondylolisthesis. Prevertebral soft tissues and predental space regions are normal.  There is disc space narrowing, marked, at C4-5, C5-6, and C6-7. There is moderate disc space narrowing at C3-4 and C7-T1. There is slightly milder narrowing at C2-3. There is moderate facet hypertrophy at all  levels bilaterally. There is no frank disc extrusion or stenosis appreciable.  There is a nodular lesion in the left lobe of the thyroid measuring 1.1 x 0.8 cm.  IMPRESSION: CT head: Extensive atrophy with small vessel disease throughout the centra semiovale bilaterally. There is no intracranial mass, hemorrhage, or acute appearing infarct. There is probable cerumen in both external auditory canals.  CT cervical spine: Multilevel osteoarthritic change. Slight spondylolisthesis is C3-4 is felt to be due to spondylosis. No fracture apparent.  There is a focal nodular lesion in the left lobe of the thyroid. Nonemergent thyroid ultrasound advised to further assess.   Electronically Signed   By: Bretta Bang M.D.   On: 11/28/2013 15:26   Dg Pelvis Portable  11/29/2013   CLINICAL DATA:  80 year old male status post left femoral ORIF. Initial encounter.  EXAM: PORTABLE PELVIS 1-2 VIEWS  COMPARISON:  0323 hr the same day and earlier.  FINDINGS: Portable AP view at 0416 hrs. Partially visible left femur intra medullary rod with proximal interlocking dynamic hip screw. The distal aspect of the IM rod is not included. Near anatomic alignment of the left intertrochanteric fracture. Overlying postoperative changes to the soft tissues. Visible pelvis appears stable.  IMPRESSION: Left femur ORIF with no adverse features.   Electronically Signed   By: Augusto Gamble M.D.   On: 11/29/2013 04:50    Assessment/Plan  Folliculitis Start keflex 500 mg bid x 1 week. Continue nystatin powder and groin skin care and monitor  Dementia with behavioral disturbance. Advanced, decline anticipated. Continue total care with fall precautions and pressure ulcer prophylaxis. Check prealbumin level and if low, get dietary consult. Continue seroquel but change administration time to 7 pm because per wiffe sundowning symptom start around 7:30 pm.  Cough With bites/ meals. Get SLP consult to assess for dysphagia and need for dietary  modification. Aspiration precautions for now. Also get cxr with rhonchi on exam  Pressure ulcer prophylaxis Get get overlay mattress, frequent repositioning, skin barrier cream if needed, skin care given decreased mobility  GAD On xanax 0.25 mg tid with 1 mg q4h prn anxiety. With him appearing sleepy, d/c current order and have him on xanax 0.25 mg bid only and monitor. Change prn order to 0.5 mg q8h prn for severe anxiety only  Major depression Stable, continue celexa 10 mg daily wth depakote sprinkles 125 mg daily  BPH Continue his cardura and proscar, no changes made  Groin fungal rash Currently on fluconazole 150 mg qod, monitor clinically  Edema Stable, continue lasix 20 mg daily prn only  gerd Stable symptom, d/c prilosec per family request   Goals of care: short term rehabilitation   Labs/tests ordered: cbc with diff, cmp, tsh, vitamin d, prealbumin  Family/ staff Communication: reviewed care plan with patient and nursing supervisor    Oneal Grout, MD  [facility] [phone] (Monday-Friday 8 am - 5 pm) [phone] (afterhours)

## 2015-06-25 ENCOUNTER — Other Ambulatory Visit: Payer: Self-pay

## 2015-06-25 MED ORDER — ALPRAZOLAM 0.5 MG PO TABS
0.5000 mg | ORAL_TABLET | Freq: Three times a day (TID) | ORAL | Status: DC | PRN
Start: 2015-06-25 — End: 2015-08-31

## 2015-06-25 NOTE — Telephone Encounter (Signed)
Rx faxed to [facility] @ [phone], phone number [phone]  

## 2015-06-29 DIAGNOSIS — F329 Major depressive disorder, single episode, unspecified: Secondary | ICD-10-CM | POA: Insufficient documentation

## 2015-07-29 ENCOUNTER — Encounter: Payer: Self-pay | Admitting: Adult Health

## 2015-07-29 ENCOUNTER — Non-Acute Institutional Stay (SKILLED_NURSING_FACILITY): Payer: Medicare Other | Admitting: Adult Health

## 2015-07-29 DIAGNOSIS — F329 Major depressive disorder, single episode, unspecified: Secondary | ICD-10-CM

## 2015-07-29 DIAGNOSIS — R609 Edema, unspecified: Secondary | ICD-10-CM | POA: Diagnosis not present

## 2015-07-29 DIAGNOSIS — N400 Enlarged prostate without lower urinary tract symptoms: Secondary | ICD-10-CM

## 2015-07-29 DIAGNOSIS — B372 Candidiasis of skin and nail: Secondary | ICD-10-CM

## 2015-07-29 DIAGNOSIS — G309 Alzheimer's disease, unspecified: Secondary | ICD-10-CM

## 2015-07-29 DIAGNOSIS — F419 Anxiety disorder, unspecified: Secondary | ICD-10-CM | POA: Diagnosis not present

## 2015-07-29 DIAGNOSIS — F0280 Dementia in other diseases classified elsewhere without behavioral disturbance: Secondary | ICD-10-CM | POA: Diagnosis not present

## 2015-08-25 ENCOUNTER — Encounter (HOSPITAL_COMMUNITY): Payer: Self-pay | Admitting: *Deleted

## 2015-08-25 ENCOUNTER — Emergency Department (HOSPITAL_COMMUNITY): Payer: Medicare Other

## 2015-08-25 ENCOUNTER — Inpatient Hospital Stay (HOSPITAL_COMMUNITY)
Admission: EM | Admit: 2015-08-25 | Discharge: 2015-08-31 | DRG: 101 | Disposition: A | Discharge status: Skilled Nursing Facility | Payer: Medicare Other | Attending: Internal Medicine | Admitting: Internal Medicine

## 2015-08-25 DIAGNOSIS — G40909 Epilepsy, unspecified, not intractable, without status epilepticus: Principal | ICD-10-CM | POA: Diagnosis present

## 2015-08-25 DIAGNOSIS — N400 Enlarged prostate without lower urinary tract symptoms: Secondary | ICD-10-CM | POA: Diagnosis present

## 2015-08-25 DIAGNOSIS — G309 Alzheimer's disease, unspecified: Secondary | ICD-10-CM | POA: Diagnosis present

## 2015-08-25 DIAGNOSIS — I10 Essential (primary) hypertension: Secondary | ICD-10-CM | POA: Diagnosis not present

## 2015-08-25 DIAGNOSIS — R739 Hyperglycemia, unspecified: Secondary | ICD-10-CM | POA: Diagnosis present

## 2015-08-25 DIAGNOSIS — Z66 Do not resuscitate: Secondary | ICD-10-CM | POA: Diagnosis present

## 2015-08-25 DIAGNOSIS — R569 Unspecified convulsions: Secondary | ICD-10-CM | POA: Diagnosis not present

## 2015-08-25 DIAGNOSIS — F0280 Dementia in other diseases classified elsewhere without behavioral disturbance: Secondary | ICD-10-CM | POA: Diagnosis present

## 2015-08-25 DIAGNOSIS — Z515 Encounter for palliative care: Secondary | ICD-10-CM | POA: Diagnosis not present

## 2015-08-25 DIAGNOSIS — R0902 Hypoxemia: Secondary | ICD-10-CM | POA: Diagnosis present

## 2015-08-25 DIAGNOSIS — Z8673 Personal history of transient ischemic attack (TIA), and cerebral infarction without residual deficits: Secondary | ICD-10-CM | POA: Diagnosis not present

## 2015-08-25 DIAGNOSIS — R627 Adult failure to thrive: Secondary | ICD-10-CM | POA: Insufficient documentation

## 2015-08-25 DIAGNOSIS — Z993 Dependence on wheelchair: Secondary | ICD-10-CM

## 2015-08-25 DIAGNOSIS — I471 Supraventricular tachycardia, unspecified: Secondary | ICD-10-CM | POA: Diagnosis present

## 2015-08-25 DIAGNOSIS — Z7982 Long term (current) use of aspirin: Secondary | ICD-10-CM | POA: Diagnosis not present

## 2015-08-25 DIAGNOSIS — Z79899 Other long term (current) drug therapy: Secondary | ICD-10-CM

## 2015-08-25 DIAGNOSIS — R29810 Facial weakness: Secondary | ICD-10-CM | POA: Diagnosis present

## 2015-08-25 DIAGNOSIS — Z7189 Other specified counseling: Secondary | ICD-10-CM | POA: Insufficient documentation

## 2015-08-25 DIAGNOSIS — I959 Hypotension, unspecified: Secondary | ICD-10-CM | POA: Diagnosis present

## 2015-08-25 DIAGNOSIS — R2990 Unspecified symptoms and signs involving the nervous system: Secondary | ICD-10-CM | POA: Insufficient documentation

## 2015-08-25 DIAGNOSIS — E785 Hyperlipidemia, unspecified: Secondary | ICD-10-CM | POA: Diagnosis present

## 2015-08-25 DIAGNOSIS — I6789 Other cerebrovascular disease: Secondary | ICD-10-CM | POA: Diagnosis not present

## 2015-08-25 LAB — BASIC METABOLIC PANEL
Anion gap: 11 (ref 5–15)
BUN: 11 mg/dL (ref 6–20)
CO2: 22 mmol/L (ref 22–32)
Calcium: 9.1 mg/dL (ref 8.9–10.3)
Chloride: 104 mmol/L (ref 101–111)
Creatinine, Ser: 0.91 mg/dL (ref 0.61–1.24)
Glucose, Bld: 146 mg/dL — ABNORMAL HIGH (ref 65–99)
POTASSIUM: 3.7 mmol/L (ref 3.5–5.1)
SODIUM: 137 mmol/L (ref 135–145)

## 2015-08-25 LAB — CBC
HCT: 49.3 % (ref 39.0–52.0)
HEMATOCRIT: 47.2 % (ref 39.0–52.0)
HEMOGLOBIN: 16.6 g/dL (ref 13.0–17.0)
Hemoglobin: 16 g/dL (ref 13.0–17.0)
MCH: 31.5 pg (ref 26.0–34.0)
MCH: 31.6 pg (ref 26.0–34.0)
MCHC: 33.9 g/dL (ref 30.0–36.0)
MCHC: 33.7 g/dL (ref 30.0–36.0)
MCV: 92.9 fL (ref 78.0–100.0)
MCV: 93.7 fL (ref 78.0–100.0)
PLATELETS: 176 10*3/uL (ref 150–400)
Platelets: 178 10*3/uL (ref 150–400)
RBC: 5.08 MIL/uL (ref 4.22–5.81)
RBC: 5.26 MIL/uL (ref 4.22–5.81)
RDW: 13.1 % (ref 11.5–15.5)
RDW: 13.3 % (ref 11.5–15.5)
WBC: 7.2 10*3/uL (ref 4.0–10.5)
WBC: 19.6 10*3/uL — AB (ref 4.0–10.5)

## 2015-08-25 LAB — VITAMIN B12: Vitamin B-12: 377 pg/mL (ref 180–914)

## 2015-08-25 LAB — BRAIN NATRIURETIC PEPTIDE: B Natriuretic Peptide: 27.3 pg/mL (ref 0.0–100.0)

## 2015-08-25 LAB — CBG MONITORING, ED: GLUCOSE-CAPILLARY: 133 mg/dL — AB (ref 65–99)

## 2015-08-25 LAB — CREATININE, SERUM
Creatinine, Ser: 1.24 mg/dL (ref 0.61–1.24)
GFR, EST NON AFRICAN AMERICAN: 54 mL/min — AB (ref >60)

## 2015-08-25 LAB — I-STAT TROPONIN, ED: TROPONIN I, POC: 0.06 ng/mL (ref 0.00–0.08)

## 2015-08-25 LAB — TSH: TSH: 1.435 u[IU]/mL (ref 0.350–4.500)

## 2015-08-25 LAB — MRSA PCR SCREENING: MRSA BY PCR: NEGATIVE

## 2015-08-25 MED ORDER — ASPIRIN 300 MG RE SUPP
300.0000 mg | Freq: Once | RECTAL | Status: AC
  Administered 2015-08-25: 300 mg via RECTAL
  Filled 2015-08-25: qty 1

## 2015-08-25 MED ORDER — ACETAMINOPHEN 650 MG RE SUPP
650.0000 mg | RECTAL | Status: DC | PRN
  Administered 2015-08-27: 650 mg via RECTAL
  Filled 2015-08-25: qty 1

## 2015-08-25 MED ORDER — METOPROLOL TARTRATE 1 MG/ML IV SOLN
5.0000 mg | Freq: Once | INTRAVENOUS | Status: AC
  Administered 2015-08-25: 5 mg via INTRAVENOUS

## 2015-08-25 MED ORDER — SODIUM CHLORIDE 0.9 % IV BOLUS (SEPSIS)
500.0000 mL | Freq: Once | INTRAVENOUS | Status: AC
  Administered 2015-08-25: 500 mL via INTRAVENOUS

## 2015-08-25 MED ORDER — ADENOSINE 6 MG/2ML IV SOLN
6.0000 mg | Freq: Once | INTRAVENOUS | Status: AC
  Administered 2015-08-25 (×2): 6 mg via INTRAVENOUS

## 2015-08-25 MED ORDER — SODIUM CHLORIDE 0.9 % IV SOLN
INTRAVENOUS | Status: AC
  Administered 2015-08-25: 125 mL via INTRAVENOUS

## 2015-08-25 MED ORDER — ADENOSINE 6 MG/2ML IV SOLN: 6.0000 mg | Freq: Once | INTRAVENOUS | Status: AC

## 2015-08-25 MED ORDER — ACETAMINOPHEN 325 MG PO TABS: 650.0000 mg | ORAL_TABLET | ORAL | Status: DC | PRN

## 2015-08-25 MED ORDER — METOPROLOL TARTRATE 1 MG/ML IV SOLN
2.5000 mg | Freq: Three times a day (TID) | INTRAVENOUS | Status: DC
  Administered 2015-08-25 – 2015-08-27 (×6): 2.5 mg via INTRAVENOUS
  Filled 2015-08-25 (×6): qty 5

## 2015-08-25 MED ORDER — SODIUM CHLORIDE 0.9 % IV SOLN
INTRAVENOUS | Status: DC
  Administered 2015-08-26 – 2015-08-31 (×7): via INTRAVENOUS

## 2015-08-25 MED ORDER — ADENOSINE 6 MG/2ML IV SOLN
INTRAVENOUS | Status: AC
  Administered 2015-08-25: 6 mg via INTRAVENOUS
  Filled 2015-08-25: qty 8

## 2015-08-25 MED ORDER — DIVALPROEX SODIUM 250 MG PO DR TAB
500.0000 mg | DELAYED_RELEASE_TABLET | Freq: Once | ORAL | Status: DC
  Filled 2015-08-25: qty 2

## 2015-08-25 MED ORDER — METOPROLOL TARTRATE 1 MG/ML IV SOLN
INTRAVENOUS | Status: AC
  Filled 2015-08-25: qty 5

## 2015-08-25 MED ORDER — SODIUM CHLORIDE 0.9 % IV BOLUS (SEPSIS)
1000.0000 mL | Freq: Once | INTRAVENOUS | Status: AC
  Administered 2015-08-25: 1000 mL via INTRAVENOUS

## 2015-08-25 MED ORDER — HEPARIN SODIUM (PORCINE) 5000 UNIT/ML IJ SOLN
5000.0000 [IU] | Freq: Three times a day (TID) | INTRAMUSCULAR | Status: DC
  Administered 2015-08-25 – 2015-08-31 (×17): 5000 [IU] via SUBCUTANEOUS
  Filled 2015-08-25 (×15): qty 1

## 2015-08-25 MED ORDER — LORAZEPAM 2 MG/ML IJ SOLN
1.0000 mg | Freq: Once | INTRAMUSCULAR | Status: DC
  Filled 2015-08-25: qty 1

## 2015-08-25 MED ORDER — ONDANSETRON HCL 4 MG/2ML IJ SOLN
INTRAMUSCULAR | Status: AC
  Filled 2015-08-25: qty 2

## 2015-08-25 MED ORDER — DEXTROSE 5 % IV SOLN
500.0000 mg | Freq: Two times a day (BID) | INTRAVENOUS | Status: DC
  Administered 2015-08-25 – 2015-08-26 (×2): 500 mg via INTRAVENOUS
  Filled 2015-08-25 (×4): qty 5

## 2015-08-25 MED ORDER — ONDANSETRON HCL 4 MG/2ML IJ SOLN
4.0000 mg | Freq: Once | INTRAMUSCULAR | Status: AC
  Administered 2015-08-25: 4 mg via INTRAVENOUS

## 2015-08-25 MED ORDER — STROKE: EARLY STAGES OF RECOVERY BOOK
Freq: Once | Status: AC
  Administered 2015-08-25: 20:00:00
  Filled 2015-08-25: qty 1

## 2015-08-25 MED ORDER — SODIUM CHLORIDE 0.9 % IV BOLUS (SEPSIS): 500.0000 mL | Freq: Once | INTRAVENOUS | Status: DC

## 2015-08-25 MED ORDER — ADENOSINE 6 MG/2ML IV SOLN
INTRAVENOUS | Status: AC
  Administered 2015-08-25: 6 mg via INTRAVENOUS
  Filled 2015-08-25: qty 4

## 2015-08-25 MED ORDER — ASPIRIN 325 MG PO TABS
325.0000 mg | ORAL_TABLET | Freq: Every day | ORAL | Status: DC
  Administered 2015-08-30 – 2015-08-31 (×2): 325 mg via ORAL
  Filled 2015-08-25 (×2): qty 1

## 2015-08-25 MED ORDER — ASPIRIN 300 MG RE SUPP
300.0000 mg | Freq: Every day | RECTAL | Status: DC
  Administered 2015-08-26 – 2015-08-29 (×4): 300 mg via RECTAL
  Filled 2015-08-25 (×4): qty 1

## 2015-08-25 MED ORDER — METOPROLOL SUCCINATE ER 25 MG PO TB24
25.0000 mg | ORAL_TABLET | Freq: Every day | ORAL | Status: DC
  Filled 2015-08-25: qty 1

## 2015-08-25 MED ORDER — VALPROATE SODIUM 500 MG/5ML IV SOLN
1500.0000 mg | Freq: Once | INTRAVENOUS | Status: AC
  Administered 2015-08-25: 1500 mg via INTRAVENOUS
  Filled 2015-08-25: qty 15

## 2015-08-25 NOTE — ED Notes (Signed)
Pt CBG, 133. Nurse was notified.  

## 2015-08-25 NOTE — Consult Note (Signed)
Admission H&P    Chief Complaint: Onset generalized seizure.  HPI: [name] [name] is an 80 y.o. male with a history of Alzheimer's dementia and I pretension as well as previous stroke brought to the emergency room following a witnessed generalized seizure. Patient has no history of seizure activity. After arriving in the emergency room he was noted to have deviation of his eyes to the right side. A she has been unresponsive verbally. He's also had heart rhythm abnormality including recurrent SVT and has been hypotensive. CT scan of his head showed diffuse advanced atrophy and microvascular changes but no acute findings. He was given 1500 mg of Depacon IV for seizure control.  Past Medical History  Diagnosis Date  . Dementia   . Hypertension   . Alzheimer disease     Past Surgical History  Procedure Laterality Date  . Intramedullary (im) nail intertrochanteric Left 11/29/2013    Procedure: INTRAMEDULLARY (IM) NAIL INTERTROCHANTRIC;  Surgeon: Meredith Pel, MD;  Location: [facility];  Service: Orthopedics;  Laterality: Left;    No family history on file. Social History:  reports that he has never smoked. He does not have any smokeless tobacco history on file. He reports that he does not drink alcohol or use illicit drugs.  Allergies: No Known Allergies  Medications: Patient's preadmission medications were reviewed by me.  ROS: History obtained from spouse  General ROS: negative for - chills, fatigue, fever, night sweats, weight gain or weight loss Psychological ROS: Severe Alzheimer's disease Ophthalmic ROS: negative for - blurry vision, double vision, eye pain or loss of vision ENT ROS: negative for - epistaxis, nasal discharge, oral lesions, sore throat, tinnitus or vertigo Allergy and Immunology ROS: negative for - hives or itchy/watery eyes Hematological and Lymphatic ROS: negative for - bleeding problems, bruising or swollen lymph nodes Endocrine ROS: negative for -  galactorrhea, hair pattern changes, polydipsia/polyuria or temperature intolerance Respiratory ROS: negative for - cough, hemoptysis, shortness of breath or wheezing Cardiovascular ROS: negative for - chest pain, dyspnea on exertion, edema or irregular heartbeat Gastrointestinal ROS: negative for - abdominal pain, diarrhea, hematemesis, nausea/vomiting or stool incontinence Genito-Urinary ROS: negative for - dysuria, hematuria, incontinence or urinary frequency/urgency Musculoskeletal ROS: negative for - joint swelling or muscular weakness Neurological ROS: as noted in HPI Dermatological ROS: negative for rash and skin lesion changes  Physical Examination: Blood pressure 88/62, pulse 85, temperature 98.5 F (36.9 C), temperature source Rectal, resp. rate 33, height 5' 10"  (1.778 m), weight 79.379 kg (175 lb), SpO2 94 %.  HEENT-  Normocephalic, no lesions, without obvious abnormality.  Normal external eye and conjunctiva.  Normal TM's bilaterally.  Normal auditory canals and external ears. Normal external nose, mucus membranes and septum.  Normal pharynx. Neck supple with no masses, nodes, nodules or enlargement. Cardiovascular - regular rate and rhythm, S1, S2 normal, no murmur, click, rub or gallop Lungs - chest clear, no wheezing, rales, normal symmetric air entry Abdomen - soft, non-tender; bowel sounds normal; no masses,  no organomegaly Extremities - no joint deformities, effusion, or inflammation and no edema  Neurologic Examination: Patient was lethargic and not responding verbally. He visually tracked only minimally to movement within the room. He did not follow any simple commands. Pupils were equal and reacted normally to light. Extraocular movements were full with right left lateral gaze. Mild facial asymmetry was noted indicative of possible left facial droop. Muscle tone was flaccid throughout. Patient had no spontaneous movements and no abnormal posturing. Deep tendon reflexes  were trace to 1+ and symmetrical. Responses were flexor bilaterally.  Results for orders placed or performed during the hospital encounter of 08/25/15 (from the past 48 hour(s))  CBG monitoring, ED     Status: Abnormal   Collection Time: 08/25/15  9:03 AM  Result Value Ref Range   Glucose-Capillary 133 (H) 65 - 99 mg/dL  Basic metabolic panel - if new onset seizures     Status: Abnormal   Collection Time: 08/25/15  9:11 AM  Result Value Ref Range   Sodium 137 135 - 145 mmol/L   Potassium 3.7 3.5 - 5.1 mmol/L   Chloride 104 101 - 111 mmol/L   CO2 22 22 - 32 mmol/L   Glucose, Bld 146 (H) 65 - 99 mg/dL   BUN 11 6 - 20 mg/dL   Creatinine, Ser 0.91 0.61 - 1.24 mg/dL   Calcium 9.1 8.9 - 10.3 mg/dL   GFR calc non Af Amer >60 >60 mL/min   GFR calc Af Amer >60 >60 mL/min    Comment: (NOTE) The eGFR has been calculated using the CKD EPI equation. This calculation has not been validated in all clinical situations. eGFR's persistently <60 mL/min signify possible Chronic Kidney Disease.    Anion gap 11 5 - 15  CBC - if new onset seizures     Status: None   Collection Time: 08/25/15  9:11 AM  Result Value Ref Range   WBC 7.2 4.0 - 10.5 K/uL   RBC 5.08 4.22 - 5.81 MIL/uL   Hemoglobin 16.0 13.0 - 17.0 g/dL   HCT 47.2 39.0 - 52.0 %   MCV 92.9 78.0 - 100.0 fL   MCH 31.5 26.0 - 34.0 pg   MCHC 33.9 30.0 - 36.0 g/dL   RDW 13.1 11.5 - 15.5 %   Platelets 176 150 - 400 K/uL  Brain natriuretic peptide     Status: None   Collection Time: 08/25/15  9:11 AM  Result Value Ref Range   B Natriuretic Peptide 27.3 0.0 - 100.0 pg/mL  I-stat troponin, ED     Status: None   Collection Time: 08/25/15 12:21 PM  Result Value Ref Range   Troponin i, poc 0.06 0.00 - 0.08 ng/mL   Comment 3            Comment: Due to the release kinetics of cTnI, a negative result within the first hours of the onset of symptoms does not rule out myocardial infarction with certainty. If myocardial infarction is still  suspected, repeat the test at appropriate intervals.    Ct Head Wo Contrast  08/25/2015  CLINICAL DATA:  Seizure, dementia EXAM: CT HEAD WITHOUT CONTRAST TECHNIQUE: Contiguous axial images were obtained from the base of the skull through the vertex without intravenous contrast. COMPARISON:  11/28/2013 FINDINGS: No skull fracture is noted. No intracranial hemorrhage, mass effect or midline shift. Paranasal sinuses and mastoid air cells are unremarkable. Extensive cerebral atrophy again noted. Extensive periventricular and subcortical chronic white matter disease. Ventriculomegaly is stable from prior exam. No acute cortical infarction. No mass lesion is noted on this unenhanced scan. IMPRESSION: No acute intracranial abnormality. Stable atrophy and extensive chronic white matter disease. No definite acute cortical infarction. Electronically Signed   By: Lahoma Crocker M.D.   On: 08/25/2015 10:17   Dg Chest Portable 1 View  08/25/2015  CLINICAL DATA:  Decreased oxygen saturation. EXAM: PORTABLE CHEST 1 VIEW COMPARISON:  November 28, 2013 FINDINGS: There is mild interstitial edema. Heart is upper normal in  size with pulmonary vascularity within normal limits. No airspace consolidation. No adenopathy. There is atherosclerotic calcification in the aorta. There is degenerative change in the thoracic spine. IMPRESSION: Mild interstitial edema. Question a degree of congestive heart failure. No airspace consolidation. Heart size within normal limits. Electronically Signed   By: Lowella Grip III M.D.   On: 08/25/2015 11:22    Assessment/Plan 80 year old man with essentially end-stage Alzheimer's dementia and history of previous stroke presenting with new onset generalized seizure activity. Etiology is unclear, but likely a manifestation of progression of Alzheimer's disease. However, recurrent acute cerebral infarction cannot be ruled out at this point.  Recommendations: 1. Continue Depacon 500 mg every 12  hours IV 2. MRI of the brain without contrast 3. EEG, routine adult study 4. Continue aspirin daily, 325 mg by mouth or 300 mg rectal suppository  We will continue to follow this patient with you.  C.R. Nicole Kindred, MD [facility]  08/25/2015, 4:43 PM

## 2015-08-25 NOTE — ED Notes (Signed)
Jacubowitz, MD informed that the pt did not pass a swallow, MD insists that the pt is to receive the medication with applesauce

## 2015-08-25 NOTE — ED Notes (Signed)
Jacubowitz, MD aware that pt has returned from X ray with a small amt of vomit on his gown, will update MD if pt has another vomiting episode for medication order

## 2015-08-25 NOTE — ED Notes (Signed)
Attempted report 

## 2015-08-25 NOTE — ED Provider Notes (Addendum)
CSN: [id number]     Arrival date & time 08/25/15  4098 History   First MD Initiated Contact with Patient 08/25/15 0911     Chief Complaint  Patient presents with  . Seizures     (Consider location/radiation/quality/duration/timing/severity/associated sxs/prior Treatment) HPI Level V caveat dementia history is obtained from patient's wife who accompanies him and from EMS and from records to accompany patient. Patient reportedly had 2 mg generalized seizure prior to coming here. He presently looks at his baseline according to his wife only slightly more agitated.. No treatment prior to coming here. Further history fromTanya Culler,, nurse at [facility] patient had seizure lasting at least 2 minutes 7:55 AM today. He had formerly been on Depakote for psychosis however Depakote was discontinued 08/05/2015 Past Medical History  Diagnosis Date  . Dementia   . Hypertension   . Alzheimer disease    Past Surgical History  Procedure Laterality Date  . Intramedullary (im) nail intertrochanteric Left 11/29/2013    Procedure: INTRAMEDULLARY (IM) NAIL INTERTROCHANTRIC;  Surgeon: Cammy Copa, MD;  Location: [facility];  Service: Orthopedics;  Laterality: Left;   No family history on file. Social History  Substance Use Topics  . Smoking status: Never Smoker   . Smokeless tobacco: None  . Alcohol Use: No   lives in skilled nursing facility. DO NOT RESUSCITATE CODE STATUS  Review of Systems  Unable to perform ROS: Dementia  Musculoskeletal: Positive for gait problem.       Wheelchair-bound  Neurological: Positive for seizures.        does not speak      Allergies  Review of patient's allergies indicates no known allergies.  Home Medications   Prior to Admission medications   Medication Sig Start Date End Date Taking? Authorizing Provider  acetaminophen (TYLENOL) 650 MG CR tablet Take 650 mg by mouth every 4 (four) hours as needed for pain.    Historical  Provider, MD  ALPRAZolam Prudy Feeler) 0.25 MG tablet Take 0.25 mg by mouth 3 (three) times daily. For mood disorder    Historical Provider, MD  ALPRAZolam (XANAX) 0.5 MG tablet Take 1 tablet (0.5 mg total) by mouth every 8 (eight) hours as needed for anxiety (For severe anxiety only). 06/25/15   Tiffany L Reed, DO  ALPRAZolam Prudy Feeler) 1 MG tablet Take 1 mg by mouth every 4 (four) hours as needed for anxiety.    Historical Provider, MD  aspirin EC 325 MG tablet Take 1 tablet (325 mg total) by mouth daily. 11/30/13   Cammy Copa, MD  citalopram (CELEXA) 10 MG tablet Take 10 mg by mouth daily. For depression    Historical Provider, MD  divalproex (DEPAKOTE SPRINKLE) 125 MG capsule Take 125 mg by mouth daily. For psychosis    Historical Provider, MD  doxazosin (CARDURA) 4 MG tablet Take 4 mg by mouth at bedtime.    Historical Provider, MD  finasteride (PROSCAR) 5 MG tablet Take 5 mg by mouth daily.    Historical Provider, MD  fluconazole (DIFLUCAN) 150 MG tablet Take 150 mg by mouth every other day.    Historical Provider, MD  furosemide (LASIX) 20 MG tablet Take 20 mg by mouth daily as needed for edema.    Historical Provider, MD  metroNIDAZOLE (METROGEL) 0.75 % gel Apply 1 application topically 2 (two) times daily. Apply to rash on face    Historical Provider, MD  NON FORMULARY Ativan gel apply topically every 4 hours as needed for anxiety or  agitation.    Historical Provider, MD  omeprazole (PRILOSEC) 20 MG capsule Take 20 mg by mouth daily.    Historical Provider, MD  QUEtiapine (SEROQUEL) 25 MG tablet Take 75 mg by mouth at bedtime.     Historical Provider, MD   Ht  (1.778 m)  Wt 175 lb (79.379 kg)  BMI 25.11 kg/m2  SpO2 98% Physical Exam  Constitutional: He appears well-developed and well-nourished.  HENT:  Head: Normocephalic and atraumatic.  Eyes: Conjunctivae are normal. Pupils are equal, round, and reactive to light.  Neck: Neck supple. No tracheal deviation present. No thyromegaly  present.  Cardiovascular: Normal rate and regular rhythm.   No murmur heard. Pulmonary/Chest: Effort normal and breath sounds normal.  Abdominal: Soft. Bowel sounds are normal. He exhibits no distension. There is no tenderness.  Musculoskeletal: Normal range of motion. He exhibits no edema or tenderness.  Neurological: He is alert. Coordination normal.  Does not follow simple commands. Nonverbal, groaning(baseline behavior per his wife.)  Skin: Skin is warm and dry. No rash noted.  Psychiatric: He has a normal mood and affect.  Nursing note and vitals reviewed.   ED Course  Procedures (including critical care time) Labs Review Labs Reviewed  CBG MONITORING, ED - Abnormal; Notable for the following:    Glucose-Capillary 133 (*)    All other components within normal limits  BASIC METABOLIC PANEL  CBC    Imaging Review No results found. I have personally reviewed and evaluated these images and lab results as part of my medical decision-making.   EKG Interpretation None     ED ECG REPORT   Date: 08/25/2015  Rate: 95  Rhythm: normal sinus rhythm and premature ventricular contractions (PVC)  QRS Axis: normal  Intervals: normal  ST/T Wave abnormalities: nonspecific T wave changes  Conduction Disutrbances:none  Narrative Interpretation:   Old EKG Reviewed: PVCs and you from 08/25/2015 otherwise unchanged  I have personally reviewed the EKG tracing and agree with the computerized printout as noted. ED ECG REPORT   Date: 08/25/2015  1405 pm  Rate: 170  Rhythm: supraventricular tachycardia (SVT)  QRS Axis: normal  Intervals: normal  ST/T Wave abnormalities: nonspecific ST/T changes  Conduction Disutrbances:none  Narrative Interpretation:   Old EKG Reviewed: changes noted SVT is new compared to previous tracing I have personally reviewed the EKG tracing and agree with the computerized printout as noted. Patient was administered adenosine 6 g IV and rhythm converted to  sinus tachycardia  1 10 bpm.  At 15 10 PM rhythm returned to SVT. He was given a second dose of adenosine 6 no grams IV rhythm converted to sinus tachycardia 1 10 bpm I spoke with Dr.Skeins from cardiology who suggested patient be started on Toprol-XL 25 mg daily. He could be followed by his primary care physician for SVT. I also spoke with Dr. Amada Jupiter who suggested restarting patient on Depakote 500 mg twice daily for seizures.   At 155 5 PM patient vomited and eyes were deviated to right. Patient was less responsive. Neurology was recontacted thought was that patient may be in status epilepticus. Ativan 1 g IV was ordered as well as Depacon 1.5 g IV for seizures.  At 16 10 PM patient noted to be again an SVT rhythm. Lopressor 5 mg IV ordered by me. 1646 p.m. patient is resting comfortably. Eyes are midline. Pulse 87 blood pressure 91/66, after treatment with IV Lopressor and 500 mg normal saline bolus Results for orders placed or performed during  the hospital encounter of 08/25/15  Basic metabolic panel - if new onset seizures  Result Value Ref Range   Sodium 137 135 - 145 mmol/L   Potassium 3.7 3.5 - 5.1 mmol/L   Chloride 104 101 - 111 mmol/L   CO2 22 22 - 32 mmol/L   Glucose, Bld 146 (H) 65 - 99 mg/dL   BUN 11 6 - 20 mg/dL   Creatinine, Ser 2.950.91 0.61 - 1.24 mg/dL   Calcium 9.1 8.9 - 62.110.3 mg/dL   GFR calc non Af Amer >60 >60 mL/min   GFR calc Af Amer >60 >60 mL/min   Anion gap 11 5 - 15  CBC - if new onset seizures  Result Value Ref Range   WBC 7.2 4.0 - 10.5 K/uL   RBC 5.08 4.22 - 5.81 MIL/uL   Hemoglobin 16.0 13.0 - 17.0 g/dL   HCT 30.847.2 65.739.0 - 84.652.0 %   MCV 92.9 78.0 - 100.0 fL   MCH 31.5 26.0 - 34.0 pg   MCHC 33.9 30.0 - 36.0 g/dL   RDW 96.213.1 95.211.5 - 84.115.5 %   Platelets 176 150 - 400 K/uL  Brain natriuretic peptide  Result Value Ref Range   B Natriuretic Peptide 27.3 0.0 - 100.0 pg/mL  CBG monitoring, ED  Result Value Ref Range   Glucose-Capillary 133 (H) 65 - 99 mg/dL   I-stat troponin, ED  Result Value Ref Range   Troponin i, poc 0.06 0.00 - 0.08 ng/mL   Comment 3           Ct Head Wo Contrast  08/25/2015  CLINICAL DATA:  Seizure, dementia EXAM: CT HEAD WITHOUT CONTRAST TECHNIQUE: Contiguous axial images were obtained from the base of the skull through the vertex without intravenous contrast. COMPARISON:  11/28/2013 FINDINGS: No skull fracture is noted. No intracranial hemorrhage, mass effect or midline shift. Paranasal sinuses and mastoid air cells are unremarkable. Extensive cerebral atrophy again noted. Extensive periventricular and subcortical chronic white matter disease. Ventriculomegaly is stable from prior exam. No acute cortical infarction. No mass lesion is noted on this unenhanced scan. IMPRESSION: No acute intracranial abnormality. Stable atrophy and extensive chronic white matter disease. No definite acute cortical infarction. Electronically Signed   By: Natasha MeadLiviu  Pop M.D.   On: 08/25/2015 10:17   Dg Chest Portable 1 View  08/25/2015  CLINICAL DATA:  Decreased oxygen saturation. EXAM: PORTABLE CHEST 1 VIEW COMPARISON:  November 28, 2013 FINDINGS: There is mild interstitial edema. Heart is upper normal in size with pulmonary vascularity within normal limits. No airspace consolidation. No adenopathy. There is atherosclerotic calcification in the aorta. There is degenerative change in the thoracic spine. IMPRESSION: Mild interstitial edema. Question a degree of congestive heart failure. No airspace consolidation. Heart size within normal limits. Electronically Signed   By: Bretta BangWilliam  Woodruff III M.D.   On: 08/25/2015 11:22  chest xray viewed by me  MDM  I spoke with Dr.Madeira patient will be admitted to stepdown unit Final diagnoses:  None   I've also obtain palliative care consult on this chronically debilitated Alzheimer's patient Diagnosis #1 seizures #2 supraventricular tachycardia #3 hypotension #4hypergylcemia CRITICAL CARE Performed by:  Doug SouJACUBOWITZ,Alexandra Lipps Total critical care time: 60 minute minutes Critical care time was exclusive of separately billable procedures and treating other patients. Critical care was necessary to treat or prevent imminent or life-threatening deterioration. Critical care was time spent personally by me on the following activities: development of treatment plan with patient and/or surrogate as well as nursing, discussions  with consultants, evaluation of patient's response to treatment, examination of patient, obtaining history from patient or surrogate, ordering and performing treatments and interventions, ordering and review of laboratory studies, ordering and review of radiographic studies, pulse oximetry and re-evaluation of patient's condition.    Doug Sou, MD 08/25/15 1651  Doug Sou, MD 08/25/15 1651

## 2015-08-25 NOTE — ED Notes (Signed)
Pt placed on zoll pads, code cart at bedside. Adenosine ordered and administered with assistance of Gabe, Charity fundraiserN. Family at bedside.

## 2015-08-25 NOTE — ED Notes (Addendum)
Pt noted to be SVT on the monitor again, additional EKG done and Dr. Ethelda ChickJacubowitz notified.

## 2015-08-25 NOTE — ED Notes (Signed)
This RN notified Jacobowitz, MD that the pt will not be receiving PO meds & that there has been neuro changes

## 2015-08-25 NOTE — Progress Notes (Signed)
Pt received from ED inc of urine and stool Cleansed CHG bath done MRSA swab done

## 2015-08-25 NOTE — ED Notes (Signed)
Pt noted to be in SVT, pt groaning at this time with eyes closed. New EKG obtained and Dr. Ethelda ChickJacubowitz notified. Pt family updated on pt status.

## 2015-08-25 NOTE — Progress Notes (Signed)
Pt's room is not clean report deffered

## 2015-08-25 NOTE — ED Notes (Signed)
Pt in from [facility], per report pt had witnessed grand mal seizure today lasting 2 minutes, no injury to tongue noted, pt neg for hx of seizures, pt hx of dementia, per facility the  Pt is back at his baseline, pt reported to have incomprehensible speech, vitals WNL

## 2015-08-25 NOTE — ED Notes (Signed)
Pt wife states pt able to eat and drink okay with no difficulty swallowing, pt in and out of sleep at this time, pt given small amount of applesauce by wife and pt noted to be holding food in mouth. Pt wife informed that for pt safety and aspiration precautions no food or drink to be given. Informed of IV fluids order for low BP. Verbalized understanding.

## 2015-08-25 NOTE — H&P (Signed)
[facility] History and Physical  [name] [name] ZOX:[id number] DOB: 1934/11/29 DOA: 08/25/2015  Referring physician: Dr. Ethelda Chick  PCP: Florentina Jenny, MD   Chief Complaint: seizure, stroke like syndrome  HPI: [name] [name] is a 80 y.o. male with PMH significant for severe dementia, HTN and BPH; who came from SNF where he resides due to seizure activity. Patient was recently taken off depakote (which he was using due to mood disorders). Patient with post ictal state after initial seizure activity. While in ED experienced two episodes of SVT (suppresed by the use of adenosine) and subsequently further deterioration on neurologic exam, with left facial droop, inability to follow commands and neurologic changes from baseline according to wife. CT head negative for acute intracranial abnormalities. Neurology consulted and [facility] called to admit patient for further evaluation and treatment.  Review of Systems:  unable to be assessed due to patient mentation. Per nursing reports has experienced 2 episodes of vomiting while in ED  Past Medical History  Diagnosis Date  . Dementia   . Hypertension   . Alzheimer disease    Past Surgical History  Procedure Laterality Date  . Intramedullary (im) nail intertrochanteric Left 11/29/2013    Procedure: INTRAMEDULLARY (IM) NAIL INTERTROCHANTRIC;  Surgeon: Cammy Copa, MD;  Location: [facility];  Service: Orthopedics;  Laterality: Left;   Social History:  reports that he has never smoked. He does not have any smokeless tobacco history on file. He reports that he does not drink alcohol or use illicit drugs.  No Known Allergies  Family history: unable to be assessed due to patient mentation   Prior to Admission medications   Medication Sig Start Date End Date Taking? Authorizing Provider  acetaminophen (TYLENOL) 650 MG CR tablet Take 650 mg by mouth every 4 (four) hours as needed for pain.   Yes Historical Provider, MD  ALPRAZolam (XANAX) 0.25  MG tablet Take 0.125 mg by mouth 2 (two) times daily. For mood disorder   Yes Historical Provider, MD  ALPRAZolam (XANAX) 0.5 MG tablet Take 1 tablet (0.5 mg total) by mouth every 8 (eight) hours as needed for anxiety (For severe anxiety only). 06/25/15  Yes Tiffany L Reed, DO  amLODipine (NORVASC) 5 MG tablet Take 5 mg by mouth daily.   Yes Historical Provider, MD  aspirin EC 325 MG tablet Take 1 tablet (325 mg total) by mouth daily. 11/30/13  Yes Cammy Copa, MD  cholecalciferol (VITAMIN D) 1000 UNITS tablet Take 1,000 Units by mouth daily.   Yes Historical Provider, MD  citalopram (CELEXA) 10 MG tablet Take 10 mg by mouth daily. For depression   Yes Historical Provider, MD  doxazosin (CARDURA) 4 MG tablet Take 4 mg by mouth at bedtime.   Yes Historical Provider, MD  finasteride (PROSCAR) 5 MG tablet Take 5 mg by mouth daily.   Yes Historical Provider, MD  furosemide (LASIX) 20 MG tablet Take 20 mg by mouth daily as needed for edema.   Yes Historical Provider, MD  QUEtiapine (SEROQUEL) 25 MG tablet Take 75 mg by mouth at bedtime.    Yes Historical Provider, MD   Physical Exam: Filed Vitals:   08/25/15 1644 08/25/15 1645 08/25/15 1700 08/25/15 1715  BP:  91/66 109/82 88/70  Pulse: 87 87 88 89  Temp:      TempSrc:      Resp: Height:      Weight:      SpO2: 96% 94% 98% 94%  Wt Readings from Last 3 Encounters:  08/25/15 79.379 kg (175 lb)  07/29/15 78.2 kg (172 lb 6.4 oz)  06/24/15 81.647 kg (180 lb)    General:  In no distress, unable to follow commands, positive left facial droop and non- verbal Eyes: PERRL, normal lids, irises & conjunctiva, no nystagmus ENT: lips & tongue w/o erythema or exudates, no drainage out of ears or nostrils. No thrush  Neck: no LAD, masses or thyromegaly, no JVD Cardiovascular: RRR, no rubs or gallops. No LE edema. Telemetry: SR, no arrhythmias; prior to my examination was on SVT with HR up to 170 and EKG with SVT Respiratory: no  wheezing, no crackles, good air movement  Abdomen: soft, no tender, no distended  Skin: no rash or induration seen on limited exam Musculoskeletal: grossly normal tone BUE/BLE, no swelling  Psychiatric: unable to be properly assessed as patient non-verbal and unable to follow commands at this moment  Neurologic: unable to follow commands, left facial droop appreciated, non-verbal (but close to baseline in that aspect according to wife). At baseline wheelchair bound          Labs on Admission:  Basic Metabolic Panel:  Recent Labs Lab 08/25/15 0911  NA 137  K 3.7  CL 104  CO2 22  GLUCOSE 146*  BUN 11  CREATININE 0.91  CALCIUM 9.1   CBC:  Recent Labs Lab 08/25/15 0911  WBC 7.2  HGB 16.0  HCT 47.2  MCV 92.9  PLT 176   BNP (last 3 results)  Recent Labs  08/25/15 0911  BNP 27.3   CBG:  Recent Labs Lab 08/25/15 0903  GLUCAP 133*    Radiological Exams on Admission: Ct Head Wo Contrast  08/25/2015  CLINICAL DATA:  Seizure, dementia EXAM: CT HEAD WITHOUT CONTRAST TECHNIQUE: Contiguous axial images were obtained from the base of the skull through the vertex without intravenous contrast. COMPARISON:  11/28/2013 FINDINGS: No skull fracture is noted. No intracranial hemorrhage, mass effect or midline shift. Paranasal sinuses and mastoid air cells are unremarkable. Extensive cerebral atrophy again noted. Extensive periventricular and subcortical chronic white matter disease. Ventriculomegaly is stable from prior exam. No acute cortical infarction. No mass lesion is noted on this unenhanced scan. IMPRESSION: No acute intracranial abnormality. Stable atrophy and extensive chronic white matter disease. No definite acute cortical infarction. Electronically Signed   By: Natasha MeadLiviu  Pop M.D.   On: 08/25/2015 10:17   Dg Chest Portable 1 View  08/25/2015  CLINICAL DATA:  Decreased oxygen saturation. EXAM: PORTABLE CHEST 1 VIEW COMPARISON:  November 28, 2013 FINDINGS: There is mild  interstitial edema. Heart is upper normal in size with pulmonary vascularity within normal limits. No airspace consolidation. No adenopathy. There is atherosclerotic calcification in the aorta. There is degenerative change in the thoracic spine. IMPRESSION: Mild interstitial edema. Question a degree of congestive heart failure. No airspace consolidation. Heart size within normal limits. Electronically Signed   By: Bretta BangWilliam  Woodruff III M.D.   On: 08/25/2015 11:22    EKG:  SVT, no acute ischemic changes  Assessment/Plan 1-Seizure [facility](HCC): new presentation; patient w/o hx of seizure in the past. But until recently was on depakote for mood stabilization. -will admit to stepdown -will check MRI -neurology consulted and will follow rec's -restarted on depakote; currently IV as patient has failed swallowing test at bedside  2-Alzheimer's dementia: severe and chronic -patient at baseline wheelchair bound and essentially non verbal -will continue supportive care[facility]  3-stroke like symptoms: left facial droop,  inability to follow even simple commands and neurologically different according to wife at bedside  -most likely due to post-ictal vs ongoing absence seizure; other considerations included TIA vs stroke  -patient's CT neg for hemorrhagic stroke  -will admit to telemetry -will check carotid dopplers, 2-D echo, MRI and EEG -ASA for secondary prevention for now -will check B12, TSH, A1C and lipid panel -neurology on board; appreciate rec's  4-BPH (benign prostatic hyperplasia): patient unable to swallow pills -will hold cardura for now  5-SVT (supraventricular tachycardia) (HCC):  -received adenosine twice while in ED -cardiology informally consulted and recommended monitoring him and start b-blocker -no CP -will check TSH and Echo -during my evaluation, patient was in NSR  6-Essential hypertension: unable to take PO meds -will use IV lopressor -will follow  VS  7-Hyperglycemia: no prior hx of diabetes -will check A1C -repeat fasting CBG  8-hypoxia: with concerns for aspiration with seizure -will monitor for now -CXR w/o frank infiltrates -no fever, no elevation on WBC's -will use PRN oxygen supplementation    Neurology (Dr. Roseanne Reno) Palliative Care (for advance directives and GOC; patient is DNR/DNI)  Code Status: DNR DVT Prophylaxis:heparin  Family Communication: wife at bedside  Disposition Plan: will admit to stepdown, inpatient, LOS > 2 midnights   Time spent: 65 minutes  Vassie Loll [facility] Pager [phone]

## 2015-08-25 NOTE — ED Notes (Signed)
Roseanne RenoStewart, MD neurology at bedside

## 2015-08-25 NOTE — ED Notes (Signed)
[name] ChickJacubowitz, MD notified re: pt in SVT, pt to receive 5mg  Lopressor IV

## 2015-08-26 ENCOUNTER — Inpatient Hospital Stay (HOSPITAL_COMMUNITY): Payer: Medicare Other

## 2015-08-26 ENCOUNTER — Encounter (HOSPITAL_COMMUNITY): Payer: Self-pay | Admitting: Internal Medicine

## 2015-08-26 DIAGNOSIS — Z515 Encounter for palliative care: Secondary | ICD-10-CM

## 2015-08-26 DIAGNOSIS — G309 Alzheimer's disease, unspecified: Secondary | ICD-10-CM

## 2015-08-26 DIAGNOSIS — R569 Unspecified convulsions: Secondary | ICD-10-CM

## 2015-08-26 DIAGNOSIS — I471 Supraventricular tachycardia: Secondary | ICD-10-CM

## 2015-08-26 DIAGNOSIS — R2990 Unspecified symptoms and signs involving the nervous system: Secondary | ICD-10-CM

## 2015-08-26 DIAGNOSIS — F0280 Dementia in other diseases classified elsewhere without behavioral disturbance: Secondary | ICD-10-CM

## 2015-08-26 DIAGNOSIS — Z7189 Other specified counseling: Secondary | ICD-10-CM

## 2015-08-26 LAB — COMPREHENSIVE METABOLIC PANEL WITH GFR
ALT: 16 U/L — ABNORMAL LOW (ref 17–63)
AST: 32 U/L (ref 15–41)
Albumin: 2.5 g/dL — ABNORMAL LOW (ref 3.5–5.0)
Alkaline Phosphatase: 71 U/L (ref 38–126)
Anion gap: 11 (ref 5–15)
BUN: 17 mg/dL (ref 6–20)
CO2: 18 mmol/L — ABNORMAL LOW (ref 22–32)
Calcium: 8.2 mg/dL — ABNORMAL LOW (ref 8.9–10.3)
Chloride: 113 mmol/L — ABNORMAL HIGH (ref 101–111)
Creatinine, Ser: 1.05 mg/dL (ref 0.61–1.24)
GFR calc Af Amer: >60 mL/min
GFR calc non Af Amer: >60 mL/min
Glucose, Bld: 109 mg/dL — ABNORMAL HIGH (ref 65–99)
Potassium: 3.9 mmol/L (ref 3.5–5.1)
Sodium: 142 mmol/L (ref 135–145)
Total Bilirubin: 1.1 mg/dL (ref 0.3–1.2)
Total Protein: 5.2 g/dL — ABNORMAL LOW (ref 6.5–8.1)

## 2015-08-26 LAB — CBC
HEMATOCRIT: 42.7 % (ref 39.0–52.0)
Hemoglobin: 14.5 g/dL (ref 13.0–17.0)
MCH: 31.9 pg (ref 26.0–34.0)
MCHC: 34 g/dL (ref 30.0–36.0)
MCV: 93.8 fL (ref 78.0–100.0)
Platelets: 159 10*3/uL (ref 150–400)
RBC: 4.55 MIL/uL (ref 4.22–5.81)
RDW: 13.7 % (ref 11.5–15.5)
WBC: 14.8 10*3/uL — ABNORMAL HIGH (ref 4.0–10.5)

## 2015-08-26 LAB — LIPID PANEL
CHOLESTEROL: 179 mg/dL (ref 0–200)
HDL: 33 mg/dL — ABNORMAL LOW (ref >40)
LDL Cholesterol: 135 mg/dL — ABNORMAL HIGH (ref 0–99)
TRIGLYCERIDES: 53 mg/dL (ref <150)
Total CHOL/HDL Ratio: 5.4 RATIO
VLDL: 11 mg/dL (ref 0–40)

## 2015-08-26 LAB — VALPROIC ACID LEVEL: Valproic Acid Lvl: 71 ug/mL (ref 50.0–100.0)

## 2015-08-26 MED ORDER — DILTIAZEM HCL 100 MG IV SOLR
5.0000 mg/h | INTRAVENOUS | Status: DC
  Administered 2015-08-26 – 2015-08-28 (×3): 5 mg/h via INTRAVENOUS
  Filled 2015-08-26 (×3): qty 100

## 2015-08-26 MED ORDER — CETYLPYRIDINIUM CHLORIDE 0.05 % MT LIQD
7.0000 mL | Freq: Two times a day (BID) | OROMUCOSAL | Status: DC
  Administered 2015-08-26 – 2015-08-31 (×10): 7 mL via OROMUCOSAL

## 2015-08-26 MED ORDER — VALPROATE SODIUM 500 MG/5ML IV SOLN
500.0000 mg | Freq: Three times a day (TID) | INTRAVENOUS | Status: DC
  Administered 2015-08-26 – 2015-08-29 (×9): 500 mg via INTRAVENOUS
  Filled 2015-08-26 (×10): qty 5

## 2015-08-26 MED ORDER — METOPROLOL TARTRATE 1 MG/ML IV SOLN
2.5000 mg | Freq: Once | INTRAVENOUS | Status: AC
  Administered 2015-08-26: 2.5 mg via INTRAVENOUS

## 2015-08-26 MED ORDER — METOPROLOL TARTRATE 1 MG/ML IV SOLN
INTRAVENOUS | Status: AC
  Filled 2015-08-26: qty 5

## 2015-08-26 NOTE — Care Management Note (Signed)
Case Management Note  Patient Details  Name: [name] [name] MRN: [id number] Date of Birth: December 13, 1934  Subjective/Objective:    [facility], spouse is considering transfer home with hospice services if pt's condition does not change.  Discussed plan with her and she admitted that she was not going to be able to provide 24/7 care.  Pt is @ SNF through his long-term care insurance plus private pay and will be eligible for hospice benefit @ SNF.  Spouse feels this may be the best option for her and for pt., she is waiting until tomorrow to see if he makes a significant recovery.                              Expected Discharge Plan:  Skilled Nursing Facility  In-House Referral:  Clinical Social Work  Discharge planning Services  CM Consult  Status of Service:  In process, will continue to follow  Magdalene RiverMayo, Airanna Partin T, RN 08/26/2015, 3:01 PM

## 2015-08-26 NOTE — Progress Notes (Signed)
Subjective: Patient is awake but post ictal  Exam: Filed Vitals:   08/26/15 0526  BP:   Pulse: 93  Temp:   Resp: 18    HEENT-  Normocephalic, no lesions, without obvious abnormality.  Normal external eye and conjunctiva.  Normal TM's bilaterally.  Normal auditory canals and external ears. Normal external nose, mucus membranes and septum.  Normal pharynx. Cardiovascular- SVT Lungs- chest clear, no wheezing, rales, normal symmetric air entry Abdomen- normal findings: bowel sounds normal Extremities- no edema     Gen: In bed, NAD MS: awake, follows no commands, once attempted to talk, blinks to threat CN: PERRLA, EOMI, blinks to threat, left facial droop (old) Motor: not moving any extremity spontaneous or purposefull Sensory: winces to pain in both UE and LE   Pertinent Labs: Ca 8.2 Cl 113 AST 32 ALT 52 WBC 14.8  Felicie MornDavid Smith PA-C [facility] [phone]  Impression: [name] with new onset seizures in the setting of dementia. I suspect that he has a previously unrecognized seizure predisposition due to his dementia that was being treated with depakote that had been prescribed for behavioral control. It was stopped a couple of weeks ago due to him sleeping a lot.    Recommendations: 1) I have contacted IM re: SVT 2) Continue depakote at 500mg  TID.  3) Will continue to follow.   Ritta SlotMcNeill Kameryn Tisdel, MD [facility] [phone]  If 7pm- 7am, please page neurology on call as listed in AMION.  08/26/2015, 10:24 AM

## 2015-08-26 NOTE — Evaluation (Signed)
Clinical/Bedside Swallow Evaluation Patient Details  Name: [name] [name] MRN: [id number] Date of Birth: 1935/01/17  Today's Date: 08/26/2015 Time:        Past Medical History:  Past Medical History  Diagnosis Date  . Dementia   . Hypertension   . Alzheimer disease    Past Surgical History:  Past Surgical History  Procedure Laterality Date  . Intramedullary (im) nail intertrochanteric Left 11/29/2013    Procedure: INTRAMEDULLARY (IM) NAIL INTERTROCHANTRIC;  Surgeon: Cammy CopaGregory Scott Dean, MD;  Location: [facility];  Service: Orthopedics;  Laterality: Left;   HPI:  80 y.o. male with PMH significant for severe dementia, HTN and BPH; who came from SNF where he resides due to seizure activity.Per MD note pt experienced two episodes of SVT  and subsequently further deterioration on neurologic exam, with left facial droop, inability to follow commands and neurologic changes from baseline according to wife. CT head negative for acute intracranial abnormalities. MRI no acute intracranial findings. No features to suggest acuteCXR. No airspace consolidation.   Assessment / Plan / Recommendation Clinical Impression  Pt seen for swallow assessment. Pt sleepy/drowsy with max verbal and tactile cues required to remain awake and attend to task in post ictal state. Decreased oral manipulation with ice chips, suspect delayed swallow initiation and increased aspiration risk due to decreased alertness and awareness. Wife denies swallow impairments prior and reports he ate a regular texture diet. Remain NPO with consistent oral care. ST to return next date.      Aspiration Risk  Severe    Diet Recommendation NPO        Other  Recommendations Oral Care Recommendations: Oral care QID   Follow Up Recommendations       Frequency and Duration min 2x/week  2 weeks   Pertinent Vitals/Pain none    SLP Swallow Goals     Swallow Study Prior Functional Status  Type of Home: House Available Help at  Discharge: Available 24 hours/day;Family (supervision only)    General Other Pertinent Information: 80 y.o. male with PMH significant for severe dementia, HTN and BPH; who came from SNF where he resides due to seizure activity.Per MD note pt experienced two episodes of SVT  and subsequently further deterioration on neurologic exam, with left facial droop, inability to follow commands and neurologic changes from baseline according to wife. CT head negative for acute intracranial abnormalities. MRI no acute intracranial findings. No features to suggest acuteCXR. No airspace consolidation. Previous Swallow Assessment:  (none) Respiratory Status: Supplemental O2 delivered via (comment) History of Recent Intubation: No Behavior/Cognition: Lethargic/Drowsy;Cooperative;Requires cueing Oral Cavity - Dentition: Adequate natural dentition/normal for age Baseline Vocal Quality: Low vocal intensity Volitional Cough: Cognitively unable to elicit Volitional Swallow: Unable to elicit    Oral/Motor/Sensory Function Overall Oral Motor/Sensory Function:  (unable to perform, generalized weakness)   Ice Chips Ice chips: Impaired Presentation: Spoon Oral Phase Impairments: Reduced labial seal;Reduced lingual movement/coordination;Impaired anterior to posterior transit;Poor awareness of bolus Oral Phase Functional Implications: Prolonged oral transit Pharyngeal Phase Impairments: Suspected delayed Swallow   Thin Liquid Thin Liquid: Not tested    Nectar Thick Nectar Thick Liquid: Not tested   Honey Thick Honey Thick Liquid: Not tested   Puree Puree: Not tested   Solid   GO    Solid: Not tested       [name] [name], [name] [name] 08/26/2015,2:15 PM   [name] CoonsLisa [name] Lonell FaceLitaker M.Ed [employer]-SLP Pager [phone]

## 2015-08-26 NOTE — Progress Notes (Signed)
Initial Nutrition Assessment  DOCUMENTATION CODES:   Not applicable  INTERVENTION:    If TF desired, recommend -- initiate Jevity 1.2 formula at 20 ml/hr and increase by 10 ml every 4 hours to goal rate of 60 ml/hr  TF regimen to provide 1728 kcals, 80 gm protein, 1162 ml of free water  NUTRITION DIAGNOSIS:   Inadequate oral intake related to inability to eat as evidenced by NPO status   GOAL:   Patient will meet greater than or equal to 90% of their needs  MONITOR:   Diet advancement, PO intake, Labs, Weight trends, I & O's  REASON FOR ASSESSMENT:   Low Braden  ASSESSMENT:   80 y.o. Male with PMH significant for severe dementia, HTN and BPH; who came from SNF where he resides due to seizure activity.  Per MD note pt experienced two episodes of SVT and subsequently further deterioration on neurologic exam, with left facial droop, inability to follow commands and neurologic changes from baseline according to wife. CT head negative for acute intracranial abnormalities. MRI no acute intracranial findings.  RD unable to obtain nutrition hx or complete Nutrition Focused Physical Exam at time of visit.  Cardiologist assessing patient's heart rate.  Pt s/p bedside swallow evaluation 11/3.  Pt with severe aspiration risk.  SLP recommending NPO status.  Low braden score places patient at risk for skin breakdown.  Palliative Care Team consulted.  Diet Order:  Diet NPO time specified  Skin:  Reviewed, no issues  Last BM:  11/3  Height:   Ht Readings from Last 1 Encounters:  08/25/15 5\' 10"  (1.778 m)    Weight:   Wt Readings from Last 1 Encounters:  08/25/15 175 lb (79.379 kg)    Ideal Body Weight:  75.4 kg  BMI:  Body mass index is 25.11 kg/(m^2).  Estimated Nutritional Needs:   Kcal:  1600-1800  Protein:  80-90 gm  Fluid:  1.6-1.8 L  EDUCATION NEEDS:   No education needs identified at this time  Maureen ChattersKatie Rosland Riding, RD, LDN Pager #: [phone] After-Hours  Pager #: [phone]

## 2015-08-26 NOTE — Evaluation (Signed)
Physical Therapy Evaluation Patient Details Name: [name] [name] MRN: [id number] DOB: December 24, 1934 Today's Date: 08/26/2015   History of Present Illness  [name] [name] is a 80 y.o. male with PMH significant for severe dementia, HTN and BPH; who came from SNF where he resides due to seizure activity. Patient was recently taken off depakote (which he was using due to mood disorders). Patient with post ictal state after initial seizure activity. While in ED experienced two episodes of SVT (suppresed by the use of adenosine) and subsequently further deterioration on neurologic exam, with left facial droop, inability to follow commands and neurologic changes from baseline according to wife. CT head negative for acute intracranial abnormalities. Neurology consulted and [facility] called to admit patient for further evaluation and treatment.  Clinical Impression  Pt admitted with above diagnosis. Pt currently with functional limitations due to the deficits listed below (see PT Problem List). Pt unable to follow any commands.  Total assist for rolling with poor responsiveness.  Wife present and d/c plan to be determined depending on how next day goes medically.  If pt is bedbound with decr responsiveness, wife wants pt to go home with Hospice care.  Pt will then need equipment as below as well as HHaide and HHSW and HHRN.  If pt is responsive, wife wants SNF with therapies.  Will follow acutely and assist with d/c planning as needs arise.   Pt will benefit from skilled PT to increase their independence and safety with mobility to allow discharge to the venue listed below.    Follow Up Recommendations Other (comment);Supervision/Assistance - 24 hour (depends on pt medical progress, SNF vs. home with Hospice)    Equipment Recommendations  Hospital bed   Recommendations for Other Services       Precautions / Restrictions Precautions Precautions: Fall Restrictions Weight Bearing Restrictions: No       Mobility  Bed Mobility Overal bed mobility: Needs Assistance;+2 for physical assistance Bed Mobility: Rolling Rolling: Total assist;+2 for physical assistance         General bed mobility comments: Pt with extensor tone all four extremities.  Needed to use pad to assist with turns and pt unable to assist at all.   Transfers                    Ambulation/Gait                Stairs            Wheelchair Mobility    Modified Rankin (Stroke Patients Only)       Balance                                             Pertinent Vitals/Pain Pain Assessment: Faces Faces Pain Scale: Hurts little more Pain Location: generalized Pain Descriptors / Indicators: Grimacing Pain Intervention(s): Monitored during session;Limited activity within patient's tolerance;Repositioned  VSS    Home Living Family/patient expects to be discharged to:: Hospice/Palliative care Living Arrangements: Spouse/significant other;Children Available Help at Discharge: Available 24 hours/day;Family (supervision only) Type of Home: House         Home Equipment: None Additional Comments: Wife had been caring for pt for nine years    Prior Function Level of Independence: Needs assistance   Gait / Transfers Assistance Needed: min to mod assist transfer up until recently NH was using hoyer lift per  wife  ADL's / Homemaking Assistance Needed: B/D total assist  Comments: Has gotten alot worse this admissionn as he was eating and responding prior to this admit     Hand Dominance        Extremity/Trunk Assessment   Upper Extremity Assessment: Defer to OT evaluation           Lower Extremity Assessment: RLE deficits/detail;LLE deficits/detail RLE Deficits / Details: tone noted with no active movement to command LLE Deficits / Details: tone noted with no active movement to command     Communication   Communication: Expressive difficulties (Per previous  admit in 2/15, pt mumbles per OT)  Cognition Arousal/Alertness: Lethargic Behavior During Therapy: Flat affect Overall Cognitive Status: History of cognitive impairments - at baseline                      General Comments General comments (skin integrity, edema, etc.): Nursing in room and placed a dresssing on red area on buttocks.  Assisted nursing with rolling as pad was dirty.  Changed linens.     Exercises General Exercises - Lower Extremity Heel Slides: AAROM;Both;5 reps;Supine      Assessment/Plan    PT Assessment Patient needs continued PT services  PT Diagnosis Generalized weakness   PT Problem List Decreased balance;Decreased activity tolerance;Decreased mobility;Decreased strength;Decreased range of motion;Decreased cognition  PT Treatment Interventions DME instruction;Functional mobility training;Therapeutic activities;Therapeutic exercise;Patient/family education   PT Goals (Current goals can be found in the Care Plan section) Acute Rehab PT Goals Patient Stated Goal: unable to assess per pt, wife wants to take pt home PT Goal Formulation: With patient/family Time For Goal Achievement: 09/09/15 Potential to Achieve Goals: Fair    Frequency Min 1X/week   Barriers to discharge Decreased caregiver support (wife with broken left UE and daughter works)      Co-evaluation               End of Session   Activity Tolerance: Patient limited by lethargy Patient left: in bed;with call bell/phone within reach;with family/visitor present Nurse Communication: Mobility status;Need for lift equipment         Time: [id number] PT Time Calculation (min) (ACUTE ONLY): 15 min   Charges:   PT Evaluation $Initial PT Evaluation Tier I: 1 Procedure     PT G CodesBerline Lopes:        [name] F 08/26/2015, 1:55 PM  [name] [facility],PT Acute Rehabilitation [phone] (pager)

## 2015-08-26 NOTE — Progress Notes (Signed)
PROGRESS NOTE  [name] [name] ZOX:[id number] DOB: 1935/04/30 DOA: 08/25/2015 PCP: Florentina Jenny, MD   HPI: 80 y.o. male with PMH significant for severe dementia, HTN and BPH; who came from SNF where he resides due to seizure activity. Patient was recently taken off depakote (which he was using due to mood disorders). Patient with post ictal state after initial seizure activity. While in ED experienced two episodes of SVT (suppresed by the use of adenosine) and subsequently further deterioration on neurologic exam, with left facial droop, inability to follow commands and neurologic changes from baseline according to wife. CT head negative for acute intracranial abnormalities  Subjective / 24 H Interval events Patient non verbal this morning, can track me with his eyes. Does not follow commands  Assessment/Plan: Principal Problem:   Seizure (HCC) Active Problems:   Alzheimer's dementia   BPH (benign prostatic hyperplasia)   SVT (supraventricular tachycardia) (HCC)   Essential hypertension   Facial droop   Hyperglycemia  Seizure (HCC)  - new presentation; patient w/o hx of seizure in the past.  - neurology consulted, restarted on depakote. EEG pending - MRI without acute findings  Alzheimer's dementia: severe and chronic - patient at baseline wheelchair bound and essentially non verbal - will continue supportive care; lives at [facility] - palliative consulted in the ED, agree with consult  BPH (benign prostatic hyperplasia): patient unable to swallow pills - will hold cardura for now  SVT (supraventricular tachycardia) (HCC):  - received adenosine twice while in ED - cardiology informally consulted and recommended monitoring him and start b-blocker, however his SVT has recurred and is sustaining. BP normal, rates ~160. IV Metop x 1, consult cardiology  - 2D echo pending  Essential hypertension: unable to take PO meds - will use IV lopressor - will follow VS  Hyperglycemia: no  prior hx of diabetes - will check A1C - repeat fasting CBG  Hypoxia: with concerns for aspiration with seizure - will monitor for now - CXR w/o frank infiltrates - no fever, no elevation on WBC's - will use PRN oxygen supplementation    Diet: Diet NPO time specified Fluids: NS DVT Prophylaxis: heparin  Code Status: DNR Family Communication: d/w wife bedside  Disposition Plan: TBD  Barriers to discharge: SVT, AMS  Consultants:  Neurology   Palliative  Cardiology   Procedures:  None    Antibiotics  Anti-infectives    None       Studies  Ct Head Wo Contrast  08/25/2015  CLINICAL DATA:  Seizure, dementia EXAM: CT HEAD WITHOUT CONTRAST TECHNIQUE: Contiguous axial images were obtained from the base of the skull through the vertex without intravenous contrast. COMPARISON:  11/28/2013 FINDINGS: No skull fracture is noted. No intracranial hemorrhage, mass effect or midline shift. Paranasal sinuses and mastoid air cells are unremarkable. Extensive cerebral atrophy again noted. Extensive periventricular and subcortical chronic white matter disease. Ventriculomegaly is stable from prior exam. No acute cortical infarction. No mass lesion is noted on this unenhanced scan. IMPRESSION: No acute intracranial abnormality. Stable atrophy and extensive chronic white matter disease. No definite acute cortical infarction. Electronically Signed   By: Natasha Mead M.D.   On: 08/25/2015 10:17   [name]  08/26/2015  CLINICAL DATA:  Patient with severe dementia who presents with seizure activity. Patient recently withdrawn from Depakote which was given for mood disorder. Also LEFT facial droop and inability to follow commands. EXAM: MRI HEAD WITHOUT CONTRAST TECHNIQUE: Multiplanar, multiecho pulse sequences of the brain and surrounding  structures were obtained without intravenous contrast. COMPARISON:  CT head 08/25/2015. FINDINGS: The patient was unable to remain motionless for the  exam. Small or subtle lesions could be overlooked. The study was prematurely truncated due to lack of patient cooperation. The images which are obtained are overall diagnostic. Global atrophy. Hydrocephalus ex vacuo. Extreme white matter disease and extensive cortical atrophy. No restricted diffusion. No visible acute or chronic hemorrhage, mass lesion, or extra-axial fluid. Flow voids are maintained. BILATERAL cataract extraction. No acute sinus disease. RIGHT mastoid fluid. Compared with recent CT, the appearance is similar. IMPRESSION: Motion degraded and prematurely truncated exam. No acute intracranial findings. No features to suggest acute intracranial process. No restricted diffusion to suggest recent ictal phenomenon. Electronically Signed   By: Elsie Stain M.D.   On: 08/26/2015 08:56   Dg Chest Portable 1 View  08/25/2015  CLINICAL DATA:  Decreased oxygen saturation. EXAM: PORTABLE CHEST 1 VIEW COMPARISON:  November 28, 2013 FINDINGS: There is mild interstitial edema. Heart is upper normal in size with pulmonary vascularity within normal limits. No airspace consolidation. No adenopathy. There is atherosclerotic calcification in the aorta. There is degenerative change in the thoracic spine. IMPRESSION: Mild interstitial edema. Question a degree of congestive heart failure. No airspace consolidation. Heart size within normal limits. Electronically Signed   By: Bretta Bang III M.D.   On: 08/25/2015 11:22    Objective  Filed Vitals:   08/26/15 0407 08/26/15 0526 08/26/15 0744 08/26/15 1200  BP: 108/68   102/59  Pulse: 88 93    Temp: 98.9 F (37.2 C)  98.8 F (37.1 C)   TempSrc: Axillary  Axillary   Resp: 25 18    Height:      Weight:      SpO2: 96% 94%      Intake/Output Summary (Last 24 hours) at 08/26/15 1203 Last data filed at 08/26/15 0600  Gross per 24 hour  Intake 2890.42 ml  Output    250 ml  Net 2640.42 ml   Filed Weights   08/25/15 0859  Weight: 79.379 kg (175 lb)      Exam:  GENERAL: NAD, flushed, non verbal  HEENT: head NCAT, no scleral icterus. Pupils round and reactive.  NECK: Supple.  LUNGS: Clear to auscultation. No wheezing or crackles  HEART: Regular rate and rhythm without murmur. 2+ pulses, no JVD, no peripheral edema  ABDOMEN: Soft, non-distended, non-tender. Positive bowel sounds.  NEUROLOGIC: non verbal, does not follow commands   Data Reviewed: Basic Metabolic Panel:  Recent Labs Lab 08/25/15 0911 08/25/15 1940 08/26/15 0445  NA 137  --  142  K 3.7  --  3.9  CL 104  --  113*  CO2 22  --  18*  GLUCOSE 146*  --  109*  BUN 11  --  17  CREATININE 0.91 1.24 1.05  CALCIUM 9.1  --  8.2*   Liver Function Tests:  Recent Labs Lab 08/26/15 0445  AST 32  ALT 16*  ALKPHOS 71  BILITOT 1.1  PROT 5.2*  ALBUMIN 2.5*   CBC:  Recent Labs Lab 08/25/15 0911 08/25/15 1940 08/26/15 0445  WBC 7.2 19.6* 14.8*  HGB 16.0 16.6 14.5  HCT 47.2 49.3 42.7  MCV 92.9 93.7 93.8  PLT 176 178 159   Cardiac Enzymes: No results for input(s): CKTOTAL, CKMB, CKMBINDEX, TROPONINI in the last 168 hours. BNP (last 3 results)  Recent Labs  08/25/15 0911  BNP 27.3    ProBNP (last 3 results) No  results for input(s): PROBNP in the last 8760 hours.  CBG:  Recent Labs Lab 08/25/15 0903  GLUCAP 133*    Recent Results (from the past 240 hour(s))  MRSA PCR Screening     Status: None   Collection Time: 08/25/15  6:55 PM  Result Value Ref Range Status   MRSA by PCR NEGATIVE NEGATIVE Final    Comment:        The GeneXpert MRSA Assay (FDA approved for NASAL specimens only), is one component of a comprehensive MRSA colonization surveillance program. It is not intended to diagnose MRSA infection nor to guide or monitor treatment for MRSA infections.      Scheduled Meds: . antiseptic oral rinse  7 mL Mouth Rinse BID  . aspirin  300 mg Rectal Daily   Or  . aspirin  325 mg Oral Daily  . heparin  5,000 Units Subcutaneous  3 times per day  . metoprolol      . metoprolol  2.5 mg Intravenous 3 times per day  . valproate sodium  500 mg Intravenous Q12H   Continuous Infusions: . sodium chloride 75 mL/hr at 08/26/15 0525     Pamella Pertostin Caresse Sedivy, MD [facility] Pager [phone]. If 7 PM - 7 AM, please contact night-coverage at [email or web address], password [email or web address] 08/26/2015, 12:03 PM  LOS: 1 day

## 2015-08-26 NOTE — Progress Notes (Signed)
HR SVT of 169 stat page to Dr Lafe GarinGherge to get orders Pt is a DNR wife at bedside

## 2015-08-26 NOTE — Progress Notes (Signed)
Started on Cardizem gtt at 5 mg/hr. SVT 169

## 2015-08-26 NOTE — Progress Notes (Signed)
VASCULAR LAB PRELIMINARY  PRELIMINARY  PRELIMINARY  PRELIMINARY  Bilateral lower extremity venous duplex completed.    Preliminary report:  1-39% ICA plaquing.  Vertebral artery flow is antegrade.   Gaege Sangalang, RVT 08/26/2015, 3:30 PM

## 2015-08-26 NOTE — Progress Notes (Signed)
Utilization Review Completed.Sinclaire Artiga T11/12/2014  

## 2015-08-26 NOTE — Procedures (Signed)
ELECTROENCEPHALOGRAM REPORT  Patient: [name] [name]       Room #: 2C12 EEG No. ID: [id number] Age: 80 y.o.        Sex: male Referring Physician: Wendy PoetGHERGHE, C Report Date:  08/26/2015        Interpreting Physician: Aline BrochureSTEWART,Ayona Yniguez R  History: [name] [name] is an 80 y.o. male with a history of hypertension and Alzheimer's disease who was admitted following a witnessed generalized seizure with no previous history of seizure activity. Patient was also noted to have supraventricular tachycardia on presentation.  Indications for study:  Assess severity of encephalopathy; rule out seizure activity.  Technique: This is an 18 channel routine scalp EEG performed at the bedside with bipolar and monopolar montages arranged in accordance to the international 10/20 system of electrode placement.   Description: This EEG recording was performed during wakefulness. Patient did not follow simple commands at the time of this study. Predominant background activity consisted of moderate amplitude mixed diffuse delta and theta activity with somewhat periodic generalized slow wave discharges which were frequently triphasic in configuration. No also frequent generalized sharp wave discharges with phase reversals in the right and left frontal regions. Photic stimulation was not performed. No clinical seizure activity occurred in association with periodic sharp wave discharges. Patient also developed ventricular tachycardia during the EEG recording with no change in mental status nor change in electrographic cerebral activity.  Interpretation: This EEG is abnormal with moderately severe generalized slowing of cerebral activity consistent with patient's history of Alzheimer's dementia. In addition, there were findings indicative of an epileptic disorder with epileptogenic foci involving right and left frontal regions.   Venetia MaxonR Belicia Difatta M.D. [facility] [phone]

## 2015-08-26 NOTE — Progress Notes (Signed)
Order received for now dose of Metropolol 2.5 IV now - given stat

## 2015-08-26 NOTE — Consult Note (Signed)
CARDIOLOGY CONSULT NOTE   Patient ID: [name] [name] MRN: [id number], DOB/AGE: [birth year]   Admit date: 08/25/2015 Date of Consult: 08/26/2015   Primary Physician: Florentina Jenny, MD Primary Cardiologist: new  Pt. Profile  80 year old male with HTN, HLD, BPH, h/o TIA and severe Alzheimer's dementia presented with AMS after witnessed seizure  Problem List  Past Medical History  Diagnosis Date  . Dementia   . Hypertension   . Alzheimer disease     Past Surgical History  Procedure Laterality Date  . Intramedullary (im) nail intertrochanteric Left 11/29/2013    Procedure: INTRAMEDULLARY (IM) NAIL INTERTROCHANTRIC;  Surgeon: Cammy Copa, MD;  Location: [facility];  Service: Orthopedics;  Laterality: Left;     Allergies  No Known Allergies  HPI   [name] [name] is a 80 year old male with HTN, HLD, BPH, h/o TIA and severe Alzheimer's dementia. He has no prior cardiac history. According to his wife, this is the 12th year since original diagnosis of Alzheimer's disease. Alzheimer's disease runs in the family. In 11/2013, he fell and broke his hip and received left ORIF. Now he is completely wheelchair-bound. Per his wife, patient's mental status has been deteriorating. She states the patient sometimes does not respond to all questions, but was able to follow command and recognize his family before. He has been living in [facility]. She has not heard any recent fever, chill, or cough from [facility] staff.   On 08/25/2015, patient was brought to [facility] after having witnessed seizure-like activity for 2 min. While in the ED, it was noted he had 2 episode of SVT terminated with adenosine. MRI of the brain was negative for acute stroke. Troponin was negative. TSH normal. CT of the head did not reveal any acute intracranial abnormality, stable atrophy and extensive chronic white matter disease. Neurology was consulted. MRI of the brain was negative for acute process. EEG was  obtained on 11/3 which showed moderately severe generalized slowing of cerebral activity consistent with patient's history of Alzheimer's dementia, in addition there were findings indicative of epileptic disorder was epileptogenic foci involving right and the left frontal region. Around 11:43 AM on 11/3, patient went into persistent SVT. Cardiology has been constant for SVT. Of note, patient has received 2 doses of IV Lopressor so far without any sign of slowing heart rate. His heart rate is currently persisting in 150 to 160s.     Inpatient Medications  . antiseptic oral rinse  7 mL Mouth Rinse BID  . aspirin  300 mg Rectal Daily   Or  . aspirin  325 mg Oral Daily  . heparin  5,000 Units Subcutaneous 3 times per day  . metoprolol      . metoprolol  2.5 mg Intravenous 3 times per day  . valproate sodium  500 mg Intravenous 3 times per day    Family History Family History  Problem Relation Age of Onset  . Alzheimer's disease Father   . Alzheimer's disease Sister      Social History Social History   Social History  . Marital Status: Married    Spouse Name: N/A  . Number of Children: N/A  . Years of Education: N/A   Occupational History  . Not on file.   Social History Main Topics  . Smoking status: Never Smoker   . Smokeless tobacco: Not on file  . Alcohol Use: No  . Drug Use: No  . Sexual Activity: Not on file   Other Topics Concern  .  Not on file   Social History Narrative     Review of Systems  Unable to obtain ROS due to patient's mental status.    Physical Exam  Blood pressure 102/59, pulse 93, temperature 98.8 F (37.1 C), temperature source Axillary, resp. rate 18, height 5\' 10"  (1.778 m), weight 175 lb (79.379 kg), SpO2 94 %.  General: open eyes, does not followup command. Affect flat Neuro: Alert and oriented X 3. Moves all extremities spontaneously. HEENT: Normal  Neck: Supple without bruits or JVD. Lungs:  Resp regular and unlabored, anterior exam  CTA. Heart: tachycardic. no s3, s4, or murmurs. Abdomen: Soft, non-distended, BS + x 4.  Extremities: No clubbing, cyanosis or edema. DP/PT/Radials 2+ and equal bilaterally.  Labs  No results for input(s): CKTOTAL, CKMB, TROPONINI in the last 72 hours. Lab Results  Component Value Date   WBC 14.8* 08/26/2015   HGB 14.5 08/26/2015   HCT 42.7 08/26/2015   MCV 93.8 08/26/2015   PLT 159 08/26/2015     Recent Labs Lab 08/26/15 0445  NA 142  K 3.9  CL 113*  CO2 18*  BUN 17  CREATININE 1.05  CALCIUM 8.2*  PROT 5.2*  BILITOT 1.1  ALKPHOS 71  ALT 16*  AST 32  GLUCOSE 109*   Lab Results  Component Value Date   CHOL 179 08/26/2015   HDL 33* 08/26/2015   LDLCALC 135* 08/26/2015   TRIG 53 08/26/2015   No results found for: DDIMER  Radiology/Studies  Ct Head Wo Contrast  08/25/2015  CLINICAL DATA:  Seizure, dementia EXAM: CT HEAD WITHOUT CONTRAST TECHNIQUE: Contiguous axial images were obtained from the base of the skull through the vertex without intravenous contrast. COMPARISON:  11/28/2013 FINDINGS: No skull fracture is noted. No intracranial hemorrhage, mass effect or midline shift. Paranasal sinuses and mastoid air cells are unremarkable. Extensive cerebral atrophy again noted. Extensive periventricular and subcortical chronic white matter disease. Ventriculomegaly is stable from prior exam. No acute cortical infarction. No mass lesion is noted on this unenhanced scan. IMPRESSION: No acute intracranial abnormality. Stable atrophy and extensive chronic white matter disease. No definite acute cortical infarction. Electronically Signed   By: Natasha MeadLiviu  Pop M.D.   On: 08/25/2015 10:17   [name]  08/26/2015  CLINICAL DATA:  Patient with severe dementia who presents with seizure activity. Patient recently withdrawn from Depakote which was given for mood disorder. Also LEFT facial droop and inability to follow commands. EXAM: MRI HEAD WITHOUT CONTRAST TECHNIQUE: Multiplanar,  multiecho pulse sequences of the brain and surrounding structures were obtained without intravenous contrast. COMPARISON:  CT head 08/25/2015. FINDINGS: The patient was unable to remain motionless for the exam. Small or subtle lesions could be overlooked. The study was prematurely truncated due to lack of patient cooperation. The images which are obtained are overall diagnostic. Global atrophy. Hydrocephalus ex vacuo. Extreme white matter disease and extensive cortical atrophy. No restricted diffusion. No visible acute or chronic hemorrhage, mass lesion, or extra-axial fluid. Flow voids are maintained. BILATERAL cataract extraction. No acute sinus disease. RIGHT mastoid fluid. Compared with recent CT, the appearance is similar. IMPRESSION: Motion degraded and prematurely truncated exam. No acute intracranial findings. No features to suggest acute intracranial process. No restricted diffusion to suggest recent ictal phenomenon. Electronically Signed   By: Elsie StainJohn T Curnes M.D.   On: 08/26/2015 08:56   Dg Chest Portable 1 View  08/25/2015  CLINICAL DATA:  Decreased oxygen saturation. EXAM: PORTABLE CHEST 1 VIEW COMPARISON:  November 28, 2013 FINDINGS: There is mild interstitial edema. Heart is upper normal in size with pulmonary vascularity within normal limits. No airspace consolidation. No adenopathy. There is atherosclerotic calcification in the aorta. There is degenerative change in the thoracic spine. IMPRESSION: Mild interstitial edema. Question a degree of congestive heart failure. No airspace consolidation. Heart size within normal limits. Electronically Signed   By: Bretta Bang III M.D.   On: 08/25/2015 11:22    ECG  SVT with diffuse ST depression in inferolateral leads during tachycardia.   ASSESSMENT AND PLAN  1. AMS s/p witnessed seizure with underlying baseline severe Alzhemer's dementia  - per neurology, EEG does have epileptic foci involving R and L frontal regions, AMS possible related  to post-ictal state, will observe his condition for another 24 hours  - agree with palliative care, patient has been struggling for many year with deteriorating alzheimer's  - if his mental status does not improve in 24 hours, family may consider inpatient hospice vs palliative care  2. Recurrent SVT  - given his comorbidities and mental deterioration, he is not a candidate for any invasive procedure. Noted diffuse ST depression in inferolateral leads during tachycardia.   - he is NPO given mental status and concern for aspiration. He has failed swallow eval. Will order IV diltiazem, if convert on IV dilt, will keep it on IV until we can figure out his disposition.  - echocardiogram was ordered by Internal Med, will follow result, again, he will not be a candidate for any invasive workup.  3. Alzheimer's dementia  4. HTN 5. HLD 6. h/o TIA  Patient is DNR   Signed, Azalee Course, PA-C 08/26/2015, 3:09 PM

## 2015-08-26 NOTE — Progress Notes (Addendum)
Paged DR Lafe GarinGherge about pt. SVT back up to 168 DR Lafe GarinGherge had already called a stat cardiology consult. No further med orders at this time . After last dose of Metoprolol iv Pt's HR was still SVT with a rate of 152 BP 102/59

## 2015-08-26 NOTE — Progress Notes (Signed)
HR now NSR at 72 BP 109/76 MD notified

## 2015-08-26 NOTE — Progress Notes (Signed)
IV Metoprolol 2.5 MG as currently scheduled Cardiology consult here talking to wife about pt's HX.

## 2015-08-26 NOTE — Consult Note (Signed)
Consultation Note Date: 08/26/2015   Patient Name: [name] [name]  DOB: 08/23/1935  MRN: [id number]  Age / Sex: 80 y.o., male  PCP: Florentina Jenny, MD Referring Physician: Leatha Gilding, MD  Reason for Consultation: Establishing goals of care and Psychosocial/spiritual support   Clinical Assessment/Narrative:   Consult is for review of medical treatment options, clarification of goals of care and end of life issues, disposition and options, and symptom recommendation.  This NP Lorinda Creed reviewed medical records, received report from team, assessed the patient and then meet at the patient's bedside along with his wife  to discuss diagnosis prognosis, GOC, EOL wishes disposition and options.  A detailed discussion was had today regarding advanced directives.  Concepts specific to code status, artifical feeding and hydration, continued IV antibiotics and rehospitalization was had.  The difference between a aggressive medical intervention path  and a palliative comfort care path for this patient at this time was had.  Values and goals of care important to patient and family were attempted to be elicited.  Concept of Hospice and Palliative Care were discussed  Natural trajectory and expectations at EOL were discussed.  Questions and concerns addressed.  Hard Choices booklet left for review. Family encouraged to call with questions or concerns.  PMT will continue to support holistically.  Mrs Denz understands the overall poor prognosis, she is processing her husband's mortality.  She shares happy memories and a life review of their family and marriage.    Primary Decision Maker: Wife/ [name] [name]  HCPOA: yes    SUMMARY OF RECOMMENDATIONS  -continue current medical treatment plan for next 24 hrs; family is hopeful for improvement , watchful waiting, re-evaluate in morning  -likely shift to  comfort     Code Status/Advance Care Planning: DNR    Code Status Orders        Start     Ordered   08/25/15 1752  Do not attempt resuscitation (DNR)   Continuous    Question Answer Comment  In the event of cardiac or respiratory ARREST Do not call a "code blue"   In the event of cardiac or respiratory ARREST Do not perform Intubation, CPR, defibrillation or ACLS   In the event of cardiac or respiratory ARREST Use medication by any route, position, wound care, and other measures to relive pain and suffering. May use oxygen, suction and manual treatment of airway obstruction as needed for comfort.      08/25/15 1751    Advance Directive Documentation        Most Recent Value   Type of Advance Directive  Living will, Healthcare Power of Attorney   Pre-existing out of facility DNR order (yellow form or pink MOST form)     "MOST" Form in Place?          Palliative Prophylaxis:   Aspiration, Bowel Regimen, Delirium Protocol, Frequent Pain Assessment, Oral Care and Turn Reposition    Psycho-social/Spiritual:  Support System: Strong  Additional Recommendations: Education on Hospice  Prognosis: Unable to determine  Discharge Planning: Pending  Chief Complaint/ Primary Diagnoses: Present on Admission:  . Alzheimer's dementia . SVT (supraventricular tachycardia) (HCC) . Essential hypertension . Facial droop . Hyperglycemia . BPH (benign prostatic hyperplasia)  I have reviewed the medical record, interviewed the patient and family, and examined the patient. The following aspects are pertinent.  Past Medical History  Diagnosis Date  . Dementia   . Hypertension   . Alzheimer disease    Social History  Social History  . Marital Status: Married    Spouse Name: N/A  . Number of Children: N/A  . Years of Education: N/A   Social History Main Topics  . Smoking status: Never Smoker   . Smokeless tobacco: None  . Alcohol Use: No  . Drug Use: No  . Sexual  Activity: Not Asked   Other Topics Concern  . None   Social History Narrative   History reviewed. No pertinent family history. Scheduled Meds: . antiseptic oral rinse  7 mL Mouth Rinse BID  . aspirin  300 mg Rectal Daily   Or  . aspirin  325 mg Oral Daily  . heparin  5,000 Units Subcutaneous 3 times per day  . metoprolol      . metoprolol  2.5 mg Intravenous 3 times per day  . valproate sodium  500 mg Intravenous 3 times per day   Continuous Infusions: . sodium chloride 75 mL/hr at 08/26/15 0525   PRN Meds:.acetaminophen **OR** acetaminophen Medications Prior to Admission:  Prior to Admission medications   Medication Sig Start Date End Date Taking? Authorizing Provider  acetaminophen (TYLENOL) 650 MG CR tablet Take 650 mg by mouth every 4 (four) hours as needed for pain.   Yes Historical Provider, MD  ALPRAZolam (XANAX) 0.25 MG tablet Take 0.125 mg by mouth 2 (two) times daily. For mood disorder   Yes Historical Provider, MD  ALPRAZolam (XANAX) 0.5 MG tablet Take 1 tablet (0.5 mg total) by mouth every 8 (eight) hours as needed for anxiety (For severe anxiety only). 06/25/15  Yes Tiffany L Reed, DO  amLODipine (NORVASC) 5 MG tablet Take 5 mg by mouth daily.   Yes Historical Provider, MD  aspirin EC 325 MG tablet Take 1 tablet (325 mg total) by mouth daily. 11/30/13  Yes Cammy CopaScott Gregory Dean, MD  cholecalciferol (VITAMIN D) 1000 UNITS tablet Take 1,000 Units by mouth daily.   Yes Historical Provider, MD  citalopram (CELEXA) 10 MG tablet Take 10 mg by mouth daily. For depression   Yes Historical Provider, MD  doxazosin (CARDURA) 4 MG tablet Take 4 mg by mouth at bedtime.   Yes Historical Provider, MD  finasteride (PROSCAR) 5 MG tablet Take 5 mg by mouth daily.   Yes Historical Provider, MD  furosemide (LASIX) 20 MG tablet Take 20 mg by mouth daily as needed for edema.   Yes Historical Provider, MD  QUEtiapine (SEROQUEL) 25 MG tablet Take 75 mg by mouth at bedtime.    Yes Historical  Provider, MD   No Known Allergies CBC:    Component Value Date/Time   WBC 14.8* 08/26/2015 0445   WBC 4.2 03/16/2015   HGB 14.5 08/26/2015 0445   HCT 42.7 08/26/2015 0445   PLT 159 08/26/2015 0445   MCV 93.8 08/26/2015 0445   NEUTROABS 5.9 11/29/2013 0629   LYMPHSABS 1.2 11/29/2013 0629   MONOABS 0.8 11/29/2013 0629   EOSABS 0.0 11/29/2013 0629   BASOSABS 0.0 11/29/2013 0629   Comprehensive Metabolic Panel:    Component Value Date/Time   NA 142 08/26/2015 0445   NA 137 03/16/2015   K 3.9 08/26/2015 0445   CL 113* 08/26/2015 0445   CO2 18* 08/26/2015 0445   BUN 17 08/26/2015 0445   CREATININE 1.05 08/26/2015 0445   CREATININE 0.7 03/16/2015   GLUCOSE 109* 08/26/2015 0445   CALCIUM 8.2* 08/26/2015 0445   AST 32 08/26/2015 0445   ALT 16* 08/26/2015 0445   ALKPHOS 71 08/26/2015 0445   BILITOT  1.1 08/26/2015 0445   PROT 5.2* 08/26/2015 0445   ALBUMIN 2.5* 08/26/2015 0445    Review of Systems  Unable to perform ROS   Physical Exam  Constitutional: He appears well-developed. He appears lethargic.  HENT:  Head: Normocephalic.  Mouth/Throat: Mucous membranes are dry.  Cardiovascular: Normal rate and regular rhythm.   Respiratory: He has decreased breath sounds in the right lower field and the left lower field.  GI: Soft. Normal appearance. There is no tenderness.  Neurological: He appears lethargic.  Skin: Skin is warm and dry.    Vital Signs: BP 102/59 mmHg  Pulse 93  Temp(Src) 98.8 F (37.1 C) (Axillary)  Resp 18  Ht  (1.778 m)  Wt 79.379 kg (175 lb)  BMI 25.11 kg/m2  SpO2 94% SpO2: Last BM Date: 08/25/15  O2 Device:SpO2: 94 % O2 Flow Rate: .O2 Flow Rate (L/min): 5 L/min Intake/output summary:  Intake/Output Summary (Last 24 hours) at 08/26/15 1357 Last data filed at 08/26/15 0600  Gross per 24 hour  Intake 2890.42 ml  Output    250 ml  Net 2640.42 ml   LBM:  BMP Latest Ref Rng 08/26/2015 08/25/2015 08/25/2015  Glucose 65 - 99 mg/dL 409(W) -  119(J)  BUN 6 - 20 mg/dL 17 - 11  Creatinine 4.78 - 1.24 mg/dL 2.95 6.21 3.08  Sodium 135 - 145 mmol/L 142 - 137  Potassium 3.5 - 5.1 mmol/L 3.9 - 3.7  Chloride 101 - 111 mmol/L 113(H) - 104  CO2 22 - 32 mmol/L 18(L) - 22  Calcium 8.9 - 10.3 mg/dL 8.2(L) - 9.1    Baseline Weight: Weight: 79.379 kg (175 lb) Most recent weight: Weight: 79.379 kg (175 lb)      Palliative Assessment/Data:  Flowsheet Rows        Most Recent Value   Intake Tab    Referral Department  -- [ED]   Unit at Time of Referral  ER   Palliative Care Primary Diagnosis  Neurology   Date Notified  08/25/15   Palliative Care Type  New Palliative care   Reason for referral  Clarify Goals of Care   Date of Admission  08/25/15   # of days IP prior to Palliative referral  0   Clinical Assessment    Psychosocial & Spiritual Assessment    Palliative Care Outcomes       Additional Data Reviewed: Recent Labs     08/25/15  0911  08/25/15  1940  08/26/15  0445  WBC  7.2  19.6*  14.8*  HGB  16.0  16.6  14.5  PLT  176  178  159  NA  137   --   142  BUN  11   --   17  CREATININE  0.91  1.24  1.05    Time In: 1000 Time Out: 1115 Time Total: 75 min Greater than 50%  of this time was spent counseling and coordinating care related to the above assessment and plan.  Signed by: Lorinda Creed, NP  Canary Brim, NP  08/26/2015, 1:57 PM  Please contact Palliative Medicine Team phone at [phone] for questions and concerns.

## 2015-08-26 NOTE — Progress Notes (Signed)
EEG completed, results pending. 

## 2015-08-27 DIAGNOSIS — R627 Adult failure to thrive: Secondary | ICD-10-CM

## 2015-08-27 LAB — CBC
HCT: 37.9 % — ABNORMAL LOW (ref 39.0–52.0)
HEMOGLOBIN: 12.5 g/dL — AB (ref 13.0–17.0)
MCH: 31.5 pg (ref 26.0–34.0)
MCHC: 33 g/dL (ref 30.0–36.0)
MCV: 95.5 fL (ref 78.0–100.0)
PLATELETS: 124 10*3/uL — AB (ref 150–400)
RBC: 3.97 MIL/uL — AB (ref 4.22–5.81)
RDW: 13.8 % (ref 11.5–15.5)
WBC: 9.1 10*3/uL (ref 4.0–10.5)

## 2015-08-27 LAB — BASIC METABOLIC PANEL
ANION GAP: 10 (ref 5–15)
BUN: 16 mg/dL (ref 6–20)
CO2: 24 mmol/L (ref 22–32)
Calcium: 8.4 mg/dL — ABNORMAL LOW (ref 8.9–10.3)
Chloride: 111 mmol/L (ref 101–111)
Creatinine, Ser: 0.79 mg/dL (ref 0.61–1.24)
GLUCOSE: 91 mg/dL (ref 65–99)
POTASSIUM: 3.8 mmol/L (ref 3.5–5.1)
Sodium: 145 mmol/L (ref 135–145)

## 2015-08-27 LAB — URINALYSIS, ROUTINE W REFLEX MICROSCOPIC
Bilirubin Urine: NEGATIVE
Glucose, UA: NEGATIVE mg/dL
Hgb urine dipstick: NEGATIVE
KETONES UR: 40 mg/dL — AB
LEUKOCYTES UA: NEGATIVE
NITRITE: NEGATIVE
PROTEIN: NEGATIVE mg/dL
Specific Gravity, Urine: 1.029 (ref 1.005–1.030)
UROBILINOGEN UA: 1 mg/dL (ref 0.0–1.0)
pH: 5.5 (ref 5.0–8.0)

## 2015-08-27 LAB — GLUCOSE, CAPILLARY: GLUCOSE-CAPILLARY: 85 mg/dL (ref 65–99)

## 2015-08-27 LAB — MAGNESIUM: MAGNESIUM: 2.1 mg/dL (ref 1.7–2.4)

## 2015-08-27 LAB — AMMONIA: AMMONIA: 26 umol/L (ref 9–35)

## 2015-08-27 LAB — HEMOGLOBIN A1C
Hgb A1c MFr Bld: 5.3 % (ref 4.8–5.6)
Mean Plasma Glucose: 105 mg/dL

## 2015-08-27 NOTE — Progress Notes (Signed)
EEG completed; results pending.    

## 2015-08-27 NOTE — Progress Notes (Signed)
Speech Language Pathology Treatment: Dysphagia  Patient Details Name: [name] [name] MRN: [id number] DOB: December 09, 1934 Today's Date: 08/27/2015 Time: 4098-1191 SLP Time Calculation (min) (ACUTE ONLY): 8 min  Assessment / Plan / Recommendation Clinical Impression  Dysphagia intervention (wife at hospital, not present during session). Alertness and awareness decreased from session yesterday. Exhibiting bite reflex (present yesterday as well) and grinding dentition therefore oral care performed but challenging (removed mild-mod amount bloody mucous). Pt not alert or aware enough to attempt ice chips. Palliative care met with wife yesterday- per note, predicting a likely shift to comfort care (per note). Speech Pathology will sign off. ST recommends continue oral care, if transitioned to comfort care intermittent ice chip if alert enough and desires.    HPI Other Pertinent Information: 80 y.o. male with PMH significant for severe dementia, HTN and BPH; who came from SNF where he resides due to seizure activity.Per MD note pt experienced two episodes of SVT  and subsequently further deterioration on neurologic exam, with left facial droop, inability to follow commands and neurologic changes from baseline according to wife. CT head negative for acute intracranial abnormalities. MRI no acute intracranial findings. No features to suggest acuteCXR. No airspace consolidation.   Pertinent Vitals Pain Assessment: Faces Faces Pain Scale: Hurts little more  SLP Plan  Discharge SLP treatment due to (comment)    Recommendations Diet recommendations: NPO              Oral Care Recommendations: Oral care QID Follow up Recommendations: None Plan: Discharge SLP treatment due to (comment)    GO     [name] [name] 08/27/2015, 12:14 PM   [name] [name] [name] [name].Ed [employer] [phone]

## 2015-08-27 NOTE — Progress Notes (Signed)
Daily Progress Note   Patient Name: [name] [name]       Date: 08/27/2015 DOB: 12-Jun-1935  Age: 80 y.o. MRN#: [id number] Attending Physician: Leatha Gildingostin M Gherghe, MD Primary Care Physician: [name], HENRY, MD Admit Date: 08/25/2015  Reason for Consultation/Follow-up: Establishing goals of care and Psychosocial/spiritual support  Subjective:     - continued discussion  today regarding current medical situation and limitation of life prolonging intervetnions, natural trajectory of dementia.  -explored with wife what her husband would really want for himself if he were able to internalize his current medical situation and lack of well being  - again we discussed  concepts specific to code status, artifical feeding and hydration, continued IV antibiotics  The difference between a aggressive medical intervention path  and a palliative comfort care path for this patient at this time was had.  Values and goals of care important to patient and family were attempted to be elicited.  -Natural trajectory and expectations at EOL were discussed.  Questions and concerns addressed.    -family express the need and importance to "give it a few more days to see", hopeful for improvement  -verbalize that a decision, regarding plan of care,  will need to had in the next few days.  This NP will follow up with Mrs Ladona RidgelRatliff on Monday    -PMT will continue to support holistically.   Length of Stay: 2 days  Current Medications: Scheduled Meds:  . antiseptic oral rinse  7 mL Mouth Rinse BID  . aspirin  300 mg Rectal Daily   Or  . aspirin  325 mg Oral Daily  . heparin  5,000 Units Subcutaneous 3 times per day  . valproate sodium  500 mg Intravenous 3 times per day    Continuous Infusions: . sodium chloride 75 mL/hr at 08/26/15 2053  . diltiazem (CARDIZEM) infusion 5 mg/hr (08/27/15 0922)    PRN Meds: acetaminophen **OR** acetaminophen  Palliative Performance Scale:  20 %     Vital Signs: BP  109/69 mmHg  Pulse 57  Temp(Src) 98.1 F (36.7 C) (Oral)  Resp 14  Ht 5\' 10"  (1.778 m)  Wt 79.379 kg (175 lb)  BMI 25.11 kg/m2  SpO2 98% SpO2: SpO2: 98 % O2 Device: O2 Device: Nasal Cannula O2 Flow Rate: O2 Flow Rate (L/min): 4 L/min  Intake/output summary:  Intake/Output Summary (Last 24 hours) at 08/27/15 1152 Last data filed at 08/27/15 0900  Gross per 24 hour  Intake 1257.5 ml  Output    385 ml  Net  872.5 ml   LBM: Last BM Date: 08/26/15 Baseline Weight: Weight: 79.379 kg (175 lb) Most recent weight: Weight: 79.379 kg (175 lb)  Physical Exam:              General: ill appearing HEENT: moist buccal membranes CVS: RRR Resp: decreased in bases Abd: soft NT Skin: warm and dry Neuro: minimally responsive to gentle touch and verbal stimuli   Additional Data Reviewed: Recent Labs     08/26/15  0445  08/27/15  0506  WBC  14.8*  9.1  HGB  14.5  12.5*  PLT  159  124*  NA  142  145  BUN  17  16  CREATININE  1.05  0.79     Problem List:  Patient Active Problem List   Diagnosis Date Noted  . DNR (do not resuscitate) discussion 08/26/2015  . Palliative care encounter 08/26/2015  . Stroke-like symptoms   . Seizure (HCC) 08/25/2015  .  SVT (supraventricular tachycardia) (HCC) 08/25/2015  . Essential hypertension 08/25/2015  . Facial droop 08/25/2015  . Hyperglycemia 08/25/2015  . Major depression, chronic (HCC) 06/29/2015  . Hip fracture (HCC) 11/29/2013  . Closed left hip fracture (HCC) 11/28/2013  . Alzheimer's dementia 11/28/2013  . Depression 11/28/2013  . Anxiety 11/28/2013  . HLD (hyperlipidemia) 11/28/2013  . BPH (benign prostatic hyperplasia) 11/28/2013  . Abnormal EKG 11/28/2013     Palliative Care Assessment & Plan    Code Status:  DNR  Goals of Care:  Watchful waiting thru the week-end, family is processing  Situation and pending decsions  Desire for further Chaplaincy support: no   5. Discharge Planning: Pending outcomes   Care  plan was discussed with [facility] and Dr Lafe Garin  Thank you for allowing the Palliative Medicine Team to assist in the care of this patient.   Time In:  1400 Time Out: 1635 Total Time 35 min Prolonged Time Billed  no     Greater than 50%  of this time was spent counseling and coordinating care related to the above assessment and plan.     Canary Brim, NP  08/27/2015, 11:52 AM  Please contact Palliative Medicine Team phone at [phone] for questions and concerns.

## 2015-08-27 NOTE — Progress Notes (Signed)
Patient Name: [name] [name] Date of Encounter: 08/27/2015  Primary Cardiologist: new - Dr. Duke Salvia   Principal Problem:   Seizure [facility]) Active Problems:   Alzheimer's dementia   BPH (benign prostatic hyperplasia)   SVT (supraventricular tachycardia) (HCC)   Essential hypertension   Facial droop   Hyperglycemia   DNR (do not resuscitate) discussion   Palliative care encounter   Stroke-like symptoms    SUBJECTIVE  Sleeping during my examination.   CURRENT MEDS . antiseptic oral rinse  7 mL Mouth Rinse BID  . aspirin  300 mg Rectal Daily   Or  . aspirin  325 mg Oral Daily  . heparin  5,000 Units Subcutaneous 3 times per day  . valproate sodium  500 mg Intravenous 3 times per day    OBJECTIVE  Filed Vitals:   08/27/15 0400 08/27/15 0425 08/27/15 0543 08/27/15 0826  BP: 131/77   120/70  Pulse:    65  Temp:  99.5 F (37.5 C) 99.2 F (37.3 C) 98.1 F (36.7 C)  TempSrc:  Axillary Axillary Oral  Resp: 20   15  Height:      Weight:      SpO2: 99%   99%    Intake/Output Summary (Last 24 hours) at 08/27/15 1115 Last data filed at 08/27/15 0900  Gross per 24 hour  Intake 1257.5 ml  Output    385 ml  Net  872.5 ml   Filed Weights   08/25/15 0859  Weight: 175 lb (79.379 kg)    PHYSICAL EXAM  General: Sleeping, does not respond to command HEENT:  Normal  Neck: Supple without bruits or JVD. Lungs:  Resp regular and unlabored, anterior exam CTA. Heart: RRR no s3, s4, or murmurs. Abdomen: Soft, non-distended, BS + x 4.  Extremities: No clubbing, cyanosis or edema. DP/PT/Radials 2+ and equal bilaterally.  Accessory Clinical Findings  CBC  Recent Labs  08/26/15 0445 08/27/15 0506  WBC 14.8* 9.1  HGB 14.5 12.5*  HCT 42.7 37.9*  MCV 93.8 95.5  PLT 159 124*   Basic Metabolic Panel  Recent Labs  08/26/15 0445 08/27/15 0506  NA 142 145  K 3.9 3.8  CL 113* 111  CO2 18* 24  GLUCOSE 109* 91  BUN 17 16  CREATININE 1.05 0.79  CALCIUM 8.2* 8.4*    MG  --  2.1   Liver Function Tests  Recent Labs  08/26/15 0445  AST 32  ALT 16*  ALKPHOS 71  BILITOT 1.1  PROT 5.2*  ALBUMIN 2.5*   Hemoglobin A1C  Recent Labs  08/26/15 0445  HGBA1C 5.3   Fasting Lipid Panel  Recent Labs  08/26/15 0445  CHOL 179  HDL 33*  LDLCALC 135*  TRIG 53  CHOLHDL 5.4   Thyroid Function Tests  Recent Labs  08/25/15 1846  TSH 1.435    TELE Converted to NSR around 3:07pm on 11/3, has been maintaining NSR throughout the night with HR 60s    ECG  No new EKG  Echocardiogram  pending    Radiology/Studies  Ct Head Wo Contrast  08/25/2015  CLINICAL DATA:  Seizure, dementia EXAM: CT HEAD WITHOUT CONTRAST TECHNIQUE: Contiguous axial images were obtained from the base of the skull through the vertex without intravenous contrast. COMPARISON:  11/28/2013 FINDINGS: No skull fracture is noted. No intracranial hemorrhage, mass effect or midline shift. Paranasal sinuses and mastoid air cells are unremarkable. Extensive cerebral atrophy again noted. Extensive periventricular and subcortical chronic white matter disease. Ventriculomegaly is  stable from prior exam. No acute cortical infarction. No mass lesion is noted on this unenhanced scan. IMPRESSION: No acute intracranial abnormality. Stable atrophy and extensive chronic white matter disease. No definite acute cortical infarction. Electronically Signed   By: Natasha MeadLiviu  Pop M.D.   On: 08/25/2015 10:17   [name]  08/26/2015  CLINICAL DATA:  Patient with severe dementia who presents with seizure activity. Patient recently withdrawn from Depakote which was given for mood disorder. Also LEFT facial droop and inability to follow commands. EXAM: MRI HEAD WITHOUT CONTRAST TECHNIQUE: Multiplanar, multiecho pulse sequences of the brain and surrounding structures were obtained without intravenous contrast. COMPARISON:  CT head 08/25/2015. FINDINGS: The patient was unable to remain motionless for the  exam. Small or subtle lesions could be overlooked. The study was prematurely truncated due to lack of patient cooperation. The images which are obtained are overall diagnostic. Global atrophy. Hydrocephalus ex vacuo. Extreme white matter disease and extensive cortical atrophy. No restricted diffusion. No visible acute or chronic hemorrhage, mass lesion, or extra-axial fluid. Flow voids are maintained. BILATERAL cataract extraction. No acute sinus disease. RIGHT mastoid fluid. Compared with recent CT, the appearance is similar. IMPRESSION: Motion degraded and prematurely truncated exam. No acute intracranial findings. No features to suggest acute intracranial process. No restricted diffusion to suggest recent ictal phenomenon. Electronically Signed   By: Elsie StainJohn T Curnes M.D.   On: 08/26/2015 08:56   Dg Chest Portable 1 View  08/25/2015  CLINICAL DATA:  Decreased oxygen saturation. EXAM: PORTABLE CHEST 1 VIEW COMPARISON:  November 28, 2013 FINDINGS: There is mild interstitial edema. Heart is upper normal in size with pulmonary vascularity within normal limits. No airspace consolidation. No adenopathy. There is atherosclerotic calcification in the aorta. There is degenerative change in the thoracic spine. IMPRESSION: Mild interstitial edema. Question a degree of congestive heart failure. No airspace consolidation. Heart size within normal limits. Electronically Signed   By: Bretta BangWilliam  Woodruff III M.D.   On: 08/25/2015 11:22    ASSESSMENT AND PLAN  1. AMS s/p witnessed seizure with underlying baseline severe Alzhemer's dementia - per neurology, EEG does have epileptic foci involving R and L frontal regions, AMS possible related to post-ictal state, will observe his condition for another 24 hours - agree with palliative care, patient has been struggling for many year with deteriorating alzheimer's - Per neurology, extended AMS can be seen after seizure in pts with dementia,  however this is longer than expected, plan for another EEG today (done, but read yet)  2. Recurrent SVT - converted on IV diltiazem - given his comorbidities and mental deterioration, he is not a candidate for any invasive procedure. Noted diffuse ST depression in inferolateral leads during tachycardia.  - he is NPO given mental status and concern for aspiration. He has failed swallow eval. - echocardiogram was ordered by Internal Med, will follow result, again, he will not be a candidate for any invasive workup.  - will continue IV diltiazem as long as he is NPO. Will d/c scheduled IV lopressor and use 1 rate control med only.   3. Alzheimer's dementia  4. HTN 5. HLD 6. h/o TIA  Patient is DNR  Signed, Amedeo PlentyMeng, Cian Costanzo PA-C Pager: [phone]

## 2015-08-27 NOTE — Progress Notes (Signed)
Subjective: No clear seizures overnight.   Exam: Filed Vitals:   08/27/15 0826  BP: 120/70  Pulse:   Temp: 98.1 F (36.7 C)  Resp:    Gen: In bed, NAD Resp: non-labored breathing, no acute distress Abd: soft, nt  Neuro: MS: Asleep, but awakens to noxious stimulation. Does not follow commands. He does fixate and tracks ZO:[id number]:Crosses midline in both directions.  Motor: Squeezes fingers when placed in the hand, but not clearly to command.  Sensory: responds to nox stim bilaterally with withdrawal and grimace.   Pertinent Labs: VP 71 yesterday.   Impression: [name] with new onset seizures in the setting of dementia. I suspect that he has a previously unrecognized seizure predisposition due to his dementia that was being treated with depakote that had been prescribed for behavioral control. It was stopped a couple of weeks ago due to him sleeping a lot.   He has continued decreased responsiveness compared to prior to admission. With dementia patients, they can take very long times to return to baseline, but this is longer than would be expected. I will repeat an EEG again today.   Recommendations: 1) Repeat EEG 2) ammonia 3) continue vpa 500mg  TID.   Ritta SlotMcNeill Alexea Blase, MD [facility] [phone]  If 7pm- 7am, please page neurology on call as listed in AMION.

## 2015-08-27 NOTE — Progress Notes (Signed)
OT Cancellation Note/Discharge  Patient Details Name: Lovett Calenderhilip Tom MRN: [id number] DOB: [birth year]   Cancelled Treatment:    Reason Eval/Treat Not Completed: Other (comment) At baseline w/c bound and total A with ADL. OT signing off. [facility]Chenika Nevils,HILLARY  Talecia Sherlin, OTR/L  [phone] 08/27/2015 08/27/2015, 11:25 AM

## 2015-08-27 NOTE — Procedures (Signed)
ELECTROENCEPHALOGRAM REPORT  Date of Study: 08/27/2015  Patient's Name: [name] [name] MRN: [id number] Date of Birth: 03/16/1935  Referring Provider: Dr. Ritta SlotMcNeill Kirkpatrick  Clinical History: Impression: This is a 80 year old man with new onset seizures.   Medications: valproate (DEPACON) 500 mg in dextrose 5 % 50 mL IVPB acetaminophen (TYLENOL) tablet 650 mg aspirin tablet 325 mg diltiazem (CARDIZEM) 100 mg in dextrose 5 % 100 mL (1 mg/mL) infusion metoprolol (LOPRESSOR) injection 2.5 mg  Technical Summary: A multichannel digital EEG recording measured by the international 10-20 system with electrodes applied with paste and impedances below 5000 ohms performed as portable with EKG monitoring in an awake and asleep patient. Hyperventilation and photic stimulation were not performed.  The digital EEG was referentially recorded, reformatted, and digitally filtered in a variety of bipolar and referential montages for optimal display.   Description: The patient is awake and asleep during the recording. He is non-verbal and does not follow commands during the study. There is no clear posterior dominant rhythm. The background consists of a large amount of diffuse 4-5 Hz theta and 2-3 Hz delta slowing, at times with triphasic morphology. With stimulation, there is an increase in faster frequencies. During drowsiness and sleep, there is an increase in theta and delta slowing of the background, with poorly formed vertex waves and sleep spindles seen.  Hyperventilation and photic stimulation were not performed. There were occasional low to medium voltage generalized spike and sharp waves seen. There were no electrographic seizures seen.    EKG lead was unremarkable.  Impression: This awake and asleep EEG is abnormal due to the presence of: 1. Moderate diffuse slowing of the background, at times with triphasic morphology 2. Occasional generalized spike and sharp waves  Clinical Correlation of  the above findings indicates diffuse cerebral dysfunction that is non-specific in etiology and can be seen with hypoxic/ischemic injury, toxic/metabolic encephalopathies, neurodegenerative disorders, or medication effect. The presence of epileptiform discharges in generalized fashion indicates a tendency for seizures of generalized onset. There were no electrographic seizures in this study.   Patrcia DollyKaren Derionna Salvador, M.D.

## 2015-08-27 NOTE — Progress Notes (Signed)
PROGRESS NOTE  [name] [name] UJW:[id number] DOB: [birth year] DOA: 08/25/2015 PCP: [name], HENRY, MD  HPI: 80 y.o. male with PMH significant for severe dementia, HTN and BPH; who came from SNF where he resides due to seizure activity. Patient was recently taken off depakote (which he was using due to mood disorders). Patient with post ictal state after initial seizure activity. While in ED experienced two episodes of SVT (suppresed by the use of adenosine) and subsequently further deterioration on neurologic exam, with left facial droop, inability to follow commands and neurologic changes from baseline according to wife. CT head negative for acute intracranial abnormalities  Subjective / 24 H Interval events Sleeping when I entered room, opens eyes, non verbal  Assessment/Plan: Principal Problem:   Seizure (HCC) Active Problems:   Alzheimer's dementia   BPH (benign prostatic hyperplasia)   SVT (supraventricular tachycardia) (HCC)   Essential hypertension   Facial droop   Hyperglycemia   DNR (do not resuscitate) discussion   Palliative care encounter   Stroke-like symptoms  Seizure (HCC)  - new presentation; patient w/o hx of seizure in the past.  - neurology consulted, restarted on depakote. EEG with generalized slowing c/w Alzheimer, as well as findings of an epileptic disorder with epileptogenic foci involving right and left frontal regions. - MRI without acute findings - check UA  Alzheimer's dementia: severe and chronic - patient at baseline wheelchair bound and essentially non verbal - will continue supportive care; [facility] palliative consulted, appreciate input  BPH (benign prostatic hyperplasia): patient unable to swallow pills - will hold cardura for now  SVT (supraventricular tachycardia) (HCC):  - received adenosine twice while in ED - cardiology consulted, 2D echo pending, on cardizem gtt, currently NPO  Essential hypertension: unable to take PO meds -  on cardizem gtt  Hyperglycemia: no prior hx of diabetes - A1C 5.3  Hypoxia: with concerns for aspiration with seizure - will monitor for now - CXR w/o frank infiltrates - no fever, no elevation on WBC's - will use PRN oxygen supplementation    Diet: Diet NPO time specified Fluids: NS DVT Prophylaxis: heparin  Code Status: DNR Family Communication: no family bedside Disposition Plan: TBD  Barriers to discharge: SVT, AMS  Consultants:  Neurology   Palliative  Cardiology   Procedures:  None    Antibiotics  Anti-infectives    None       Studies  Ct Head Wo Contrast  08/25/2015  CLINICAL DATA:  Seizure, dementia EXAM: CT HEAD WITHOUT CONTRAST TECHNIQUE: Contiguous axial images were obtained from the base of the skull through the vertex without intravenous contrast. COMPARISON:  11/28/2013 FINDINGS: No skull fracture is noted. No intracranial hemorrhage, mass effect or midline shift. Paranasal sinuses and mastoid air cells are unremarkable. Extensive cerebral atrophy again noted. Extensive periventricular and subcortical chronic white matter disease. Ventriculomegaly is stable from prior exam. No acute cortical infarction. No mass lesion is noted on this unenhanced scan. IMPRESSION: No acute intracranial abnormality. Stable atrophy and extensive chronic white matter disease. No definite acute cortical infarction. Electronically Signed   By: Natasha MeadLiviu  Pop M.D.   On: 08/25/2015 10:17   [name]  08/26/2015  CLINICAL DATA:  Patient with severe dementia who presents with seizure activity. Patient recently withdrawn from Depakote which was given for mood disorder. Also LEFT facial droop and inability to follow commands. EXAM: MRI HEAD WITHOUT CONTRAST TECHNIQUE: Multiplanar, multiecho pulse sequences of the brain and surrounding structures were obtained without intravenous  contrast. COMPARISON:  CT head 08/25/2015. FINDINGS: The patient was unable to remain motionless  for the exam. Small or subtle lesions could be overlooked. The study was prematurely truncated due to lack of patient cooperation. The images which are obtained are overall diagnostic. Global atrophy. Hydrocephalus ex vacuo. Extreme white matter disease and extensive cortical atrophy. No restricted diffusion. No visible acute or chronic hemorrhage, mass lesion, or extra-axial fluid. Flow voids are maintained. BILATERAL cataract extraction. No acute sinus disease. RIGHT mastoid fluid. Compared with recent CT, the appearance is similar. IMPRESSION: Motion degraded and prematurely truncated exam. No acute intracranial findings. No features to suggest acute intracranial process. No restricted diffusion to suggest recent ictal phenomenon. Electronically Signed   By: Elsie Stain M.D.   On: 08/26/2015 08:56   Dg Chest Portable 1 View  08/25/2015  CLINICAL DATA:  Decreased oxygen saturation. EXAM: PORTABLE CHEST 1 VIEW COMPARISON:  November 28, 2013 FINDINGS: There is mild interstitial edema. Heart is upper normal in size with pulmonary vascularity within normal limits. No airspace consolidation. No adenopathy. There is atherosclerotic calcification in the aorta. There is degenerative change in the thoracic spine. IMPRESSION: Mild interstitial edema. Question a degree of congestive heart failure. No airspace consolidation. Heart size within normal limits. Electronically Signed   By: Bretta Bang III M.D.   On: 08/25/2015 11:22    Objective  Filed Vitals:   08/27/15 0400 08/27/15 0425 08/27/15 0543 08/27/15 0826  BP: 131/77   120/70  Pulse:      Temp:  99.5 F (37.5 C) 99.2 F (37.3 C) 98.1 F (36.7 C)  TempSrc:  Axillary Axillary Oral  Resp: 20     Height:      Weight:      SpO2: 99%       Intake/Output Summary (Last 24 hours) at 08/27/15 0904 Last data filed at 08/27/15 0848  Gross per 24 hour  Intake 1232.5 ml  Output    385 ml  Net  847.5 ml   Filed Weights   08/25/15 0859  Weight:  79.379 kg (175 lb)    Exam:  GENERAL: NAD, flushed, non verbal  HEENT: head NCAT, no scleral icterus. Pupils round and reactive.  NECK: Supple.  LUNGS: Clear to auscultation. No wheezing or crackles  HEART: Regular rate and rhythm without murmur. 2+ pulses, no JVD, no peripheral edema  ABDOMEN: Soft, non-distended, non-tender. Positive bowel sounds.  NEUROLOGIC: non verbal, does not follow commands   Data Reviewed: Basic Metabolic Panel:  Recent Labs Lab 08/25/15 0911 08/25/15 1940 08/26/15 0445 08/27/15 0506  NA 137  --  142 145  K 3.7  --  3.9 3.8  CL 104  --  113* 111  CO2 22  --  18* 24  GLUCOSE 146*  --  109* 91  BUN 11  --  17 16  CREATININE 0.91 1.24 1.05 0.79  CALCIUM 9.1  --  8.2* 8.4*  MG  --   --   --  2.1   Liver Function Tests:  Recent Labs Lab 08/26/15 0445  AST 32  ALT 16*  ALKPHOS 71  BILITOT 1.1  PROT 5.2*  ALBUMIN 2.5*   CBC:  Recent Labs Lab 08/25/15 0911 08/25/15 1940 08/26/15 0445 08/27/15 0506  WBC 7.2 19.6* 14.8* 9.1  HGB 16.0 16.6 14.5 12.5*  HCT 47.2 49.3 42.7 37.9*  MCV 92.9 93.7 93.8 95.5  PLT 176 178 159 124*   BNP (last 3 results)  Recent Labs  08/25/15  0911  BNP 27.3   CBG:  Recent Labs Lab 08/25/15 0903  GLUCAP 133*    Recent Results (from the past 240 hour(s))  MRSA PCR Screening     Status: None   Collection Time: 08/25/15  6:55 PM  Result Value Ref Range Status   MRSA by PCR NEGATIVE NEGATIVE Final    Comment:        The GeneXpert MRSA Assay (FDA approved for NASAL specimens only), is one component of a comprehensive MRSA colonization surveillance program. It is not intended to diagnose MRSA infection nor to guide or monitor treatment for MRSA infections.      Scheduled Meds: . antiseptic oral rinse  7 mL Mouth Rinse BID  . aspirin  300 mg Rectal Daily   Or  . aspirin  325 mg Oral Daily  . heparin  5,000 Units Subcutaneous 3 times per day  . metoprolol  2.5 mg Intravenous 3 times  per day  . valproate sodium  500 mg Intravenous 3 times per day   Continuous Infusions: . sodium chloride 75 mL/hr at 08/26/15 2053  . diltiazem (CARDIZEM) infusion 5 mg/hr (08/26/15 1500)     Pamella Pert, MD [facility] Pager [phone]. If 7 PM - 7 AM, please contact night-coverage at [email or web address], password [facility] 08/27/2015, 9:04 AM  LOS: 2 days

## 2015-08-28 ENCOUNTER — Inpatient Hospital Stay (HOSPITAL_COMMUNITY): Payer: Medicare Other

## 2015-08-28 DIAGNOSIS — Z7189 Other specified counseling: Secondary | ICD-10-CM | POA: Insufficient documentation

## 2015-08-28 DIAGNOSIS — I6789 Other cerebrovascular disease: Secondary | ICD-10-CM

## 2015-08-28 LAB — GLUCOSE, CAPILLARY
GLUCOSE-CAPILLARY: 85 mg/dL (ref 65–99)
GLUCOSE-CAPILLARY: 77 mg/dL (ref 65–99)

## 2015-08-28 NOTE — Progress Notes (Signed)
  Echocardiogram 2D Echocardiogram has been performed.  Delcie RochENNINGTON, Aluel Schwarz 08/28/2015, 12:28 PM

## 2015-08-28 NOTE — Progress Notes (Signed)
PROGRESS NOTE  [name] [name] ZOX:[id number] DOB: July 28, 1935 DOA: 08/25/2015 PCP: [name], HENRY, MD  HPI: 80 y.o. male with PMH significant for severe dementia, HTN and BPH; who came from SNF where he resides due to seizure activity. Patient was recently taken off depakote (which he was using due to mood disorders). Patient with post ictal state after initial seizure activity. While in ED experienced two episodes of SVT (suppresed by the use of adenosine) and subsequently further deterioration on neurologic exam, with left facial droop, inability to follow commands and neurologic changes from baseline according to wife. CT head negative for acute intracranial abnormalities  Subjective / 24 H Interval events - he appears more alert this morning, eyes open, non verbal  Assessment/Plan: Principal Problem:   Seizure (HCC) Active Problems:   Alzheimer's dementia   BPH (benign prostatic hyperplasia)   SVT (supraventricular tachycardia) (HCC)   Essential hypertension   Facial droop   Hyperglycemia   DNR (do not resuscitate) discussion   Palliative care encounter   Stroke-like symptoms   Failure to thrive in adult  Seizure [facility](HCC) with prolonged post-ictal phase - new presentation; patient w/o hx of seizure in the past.  - neurology consulted, restarted on depakote. EEG with generalized slowing c/w Alzheimer, as well as findings of an epileptic disorder with epileptogenic foci involving right and left frontal regions. - MRI without acute findings - UA negative for infection  Goals of care - discussed bedside with patient's wife today, she seems to have a good grasp on his overall clinical picture and has good insight. She understands that his dementia is irreversible and he is approaching EOL, however she is hopeful that he can recover some in the next 1-2 days. Her main concern is patient's feeding. She feels like NPO takes away from patient's comfort and dignity. [name] Moanhristopher Woodard, RN,  myself and wife discussed and she has full understanding of risks and dangers of feeding and aspiration and death.  - SLP will re-evaluate patient shortly and I am OK to allow some form of comfort feeding per family wishes.  - She re-iterated to me that he is DNR/DNI, accepts the risks of aspiration, and do not want to see him starve. She is certain that this is the patient's wish as well.  Alzheimer's dementia: severe and chronic - patient at baseline wheelchair bound and essentially non verbal - will continue supportive care[facility] consulted, appreciate input  BPH (benign prostatic hyperplasia): patient unable to swallow pills - will hold cardura for now  SVT (supraventricular tachycardia) (HCC):  - received adenosine twice while in ED - cardiology consulted, 2D echo pending, on cardizem gtt, currently NPO  Essential hypertension: unable to take PO meds - on cardizem gtt  Hyperglycemia: no prior hx of diabetes - A1C 5.3  Hypoxia: with concerns for aspiration with seizure - will monitor for now - CXR w/o frank infiltrates - no fever, no elevation on WBC's - will use PRN oxygen supplementation    Diet: Diet NPO time specified Fluids: NS DVT Prophylaxis: heparin  Code Status: DNR Family Communication: discussed with wife bedside Disposition Plan: TBD  Barriers to discharge: SVT, AMS  Consultants:  Neurology   Palliative  Cardiology   Procedures:  None    Antibiotics  Anti-infectives    None       Studies  [name]  08/26/2015  CLINICAL DATA:  Patient with severe dementia who presents with seizure activity. Patient recently withdrawn from  Depakote which was given for mood disorder. Also LEFT facial droop and inability to follow commands. EXAM: MRI HEAD WITHOUT CONTRAST TECHNIQUE: Multiplanar, multiecho pulse sequences of the brain and surrounding structures were obtained without intravenous contrast. COMPARISON:  CT head  08/25/2015. FINDINGS: The patient was unable to remain motionless for the exam. Small or subtle lesions could be overlooked. The study was prematurely truncated due to lack of patient cooperation. The images which are obtained are overall diagnostic. Global atrophy. Hydrocephalus ex vacuo. Extreme white matter disease and extensive cortical atrophy. No restricted diffusion. No visible acute or chronic hemorrhage, mass lesion, or extra-axial fluid. Flow voids are maintained. BILATERAL cataract extraction. No acute sinus disease. RIGHT mastoid fluid. Compared with recent CT, the appearance is similar. IMPRESSION: Motion degraded and prematurely truncated exam. No acute intracranial findings. No features to suggest acute intracranial process. No restricted diffusion to suggest recent ictal phenomenon. Electronically Signed   By: [name] [name] M.D.   On: 08/26/2015 08:56    Objective  Filed Vitals:   08/28/15 0300 08/28/15 0400 08/28/15 0500 08/28/15 0600  BP: 123/72 149/75 124/67 119/62  Pulse: 57 60 53 51  Temp:  98 F (36.7 C)    TempSrc:  Axillary    Resp: Height:      Weight:      SpO2: 98% 100% 98% 100%    Intake/Output Summary (Last 24 hours) at 08/28/15 0658 Last data filed at 08/28/15 0400  Gross per 24 hour  Intake   1815 ml  Output    360 ml  Net   1455 ml   Filed Weights   08/25/15 0859  Weight: 79.379 kg (175 lb)    Exam:  GENERAL: NAD, flushed, non verbal  HEENT: head NCAT, no scleral icterus. Pupils round and reactive.  NECK: Supple.  LUNGS: Clear to auscultation. No wheezing or crackles  HEART: Regular rate and rhythm without murmur. 2+ pulses, no JVD, no peripheral edema  ABDOMEN: Soft, non-distended, non-tender. Positive bowel sounds.  NEUROLOGIC: non verbal, does not follow commands   Data Reviewed: Basic Metabolic Panel:  Recent Labs Lab 08/25/15 0911 08/25/15 1940 08/26/15 0445 08/27/15 0506  NA 137  --  142 145  K 3.7  --  3.9  3.8  CL 104  --  113* 111  CO2 22  --  18* 24  GLUCOSE 146*  --  109* 91  BUN 11  --  17 16  CREATININE 0.91 1.24 1.05 0.79  CALCIUM 9.1  --  8.2* 8.4*  MG  --   --   --  2.1   Liver Function Tests:  Recent Labs Lab 08/26/15 0445  AST 32  ALT 16*  ALKPHOS 71  BILITOT 1.1  PROT 5.2*  ALBUMIN 2.5*   CBC:  Recent Labs Lab 08/25/15 0911 08/25/15 1940 08/26/15 0445 08/27/15 0506  WBC 7.2 19.6* 14.8* 9.1  HGB 16.0 16.6 14.5 12.5*  HCT 47.2 49.3 42.7 37.9*  MCV 92.9 93.7 93.8 95.5  PLT 176 178 159 124*   BNP (last 3 results)  Recent Labs  08/25/15 0911  BNP 27.3   CBG:  Recent Labs Lab 08/25/15 0903 08/27/15 1953  GLUCAP 133* 85    Recent Results (from the past 240 hour(s))  MRSA PCR Screening     Status: None   Collection Time: 08/25/15  6:55 PM  Result Value Ref Range Status   MRSA by PCR NEGATIVE NEGATIVE Final    Comment:  The GeneXpert MRSA Assay (FDA approved for NASAL specimens only), is one component of a comprehensive MRSA colonization surveillance program. It is not intended to diagnose MRSA infection nor to guide or monitor treatment for MRSA infections.      Scheduled Meds: . antiseptic oral rinse  7 mL Mouth Rinse BID  . aspirin  300 mg Rectal Daily   Or  . aspirin  325 mg Oral Daily  . heparin  5,000 Units Subcutaneous 3 times per day  . valproate sodium  500 mg Intravenous 3 times per day   Continuous Infusions: . sodium chloride 75 mL/hr at 08/28/15 0445   Time spent: 25 minutes, more than 50% bedside discussing goals of care.   Pamella Pert, MD [facility] Pager [phone]. If 7 PM - 7 AM, please contact night-coverage at [email or web address], password [facility] 08/28/2015, 6:58 AM  LOS: 3 days

## 2015-08-28 NOTE — Progress Notes (Signed)
While rounding on the patient I observed his wife attempting to provide ice to the patient.  I advised her of the risks associated with giving this patient anything by mouth.  I will attempt to contact SLP to get another swallow study performed ASAP.  The patients vital signs are stable and there is no coughing, choking, or desaturation at this time.  Will continue to monitor.

## 2015-08-28 NOTE — Progress Notes (Signed)
Subjective: Slightly more responsive overnight.   Exam: Filed Vitals:   08/28/15 0600  BP: 119/62  Pulse: 51  Temp:   Resp: 16   Gen: In bed, NAD Resp: non-labored breathing, no acute distress Abd: soft, nt  Neuro: MS: Awake, when asked to stick out tongue, opens mouth but does not protrude tongue. Will not show thumbs to command. Does close eyes to command. He does fixate and tracks ZO:[id number]:Crosses midline in both directions. dysarthric Motor: Squeezes fingers when placed in the hand, but not clearly to command.  Sensory: responds to nox stim bilaterally with withdrawal and grimace.   Pertinent Labs: Ammonia nml  Impression: [name] with new onset seizures in the setting of dementia. I suspect that he has a previously unrecognized seizure predisposition due to his dementia that was being treated with depakote that had been prescribed for behavioral control. He does seem to finally be having some improvement. I would expect any continued improvement to be slow, but there is room to return to his previous baseline, though this is not definite.    Recommendations: 1) continue vpa 500mg  TID.  2) I would continue VPA at the current dose, could change to depakote DR 750mg  BID when able to reliably take PO.  3) Neurology to sign off. Please call with any further questions or concerns.   Ritta SlotMcNeill Altin Sease, MD [facility] [phone]  If 7pm- 7am, please page neurology on call as listed in AMION.

## 2015-08-28 NOTE — Evaluation (Addendum)
Clinical/Bedside Swallow Evaluation Patient Details  Name: [name] [name] [name] MRN: [id number] Date of Birth: 09-27-1935  Today's Date: 08/28/2015 Time: SLP Start Time (ACUTE ONLY): 1320 SLP Stop Time (ACUTE ONLY): 1400 SLP Time Calculation (min) (ACUTE ONLY): 40 min  Past Medical History:  Past Medical History  Diagnosis Date  . Dementia   . Hypertension   . Alzheimer disease    Past Surgical History:  Past Surgical History  Procedure Laterality Date  . Intramedullary (im) nail intertrochanteric Left 11/29/2013    Procedure: INTRAMEDULLARY (IM) NAIL INTERTROCHANTRIC;  Surgeon: Cammy Copa, MD;  Location: [facility] ORS;  Service: Orthopedics;  Laterality: Left;   HPI:  Pt is a 80 year old with PMH of severe dementia, HTN and BPH; who came from SNF where he resides due to seizure activity, admitted 11/2 with post ictal state after initial seizure activity. This resulted in further deterioration on nuero exam with L facial droop, inability to follow commands and neurologic changes from baseline according to wife. Palliative consulted- poor prognosis, family expressed needing to "give it a few more days"- hopeful for improvement. Pt had bedside swallow eval 11/3 and at that point was minimally responsive to ice chips. No reports of swallow difficulties prior to this admission. SLP signed off 11/4 given likely shift to comfort care. RN ordered repeat BSE today given increased alertness/ responsiveness.    Assessment / Plan / Recommendation Clinical Impression  Pt reportedly improved today with alertness/ responsiveness. Continues to require mod-max verbal/ visual/ tactile cues to attend to bolus. Pt tolerated oral care with suction but did frequently bite down on swab and suction tube. Wife at bedside provided POs. Pt tolerated ice chips (chewed and swallowed) with no overt s/s of aspiration. Also tolerated puree without s/s of aspiration but did require cues to attend to bolus and had min-mod amounts  of pocketed material requiring suctioning. Attempted nectar thick liquids by teaspoon- pt had a suspected delay in swallow initiation resulting in a wet vocal quality, suspicious for penetration/ aspiration of liquid. Aspiration risk is high with liquids, appears moderate with puree material if alert. Recommend initiating dysphagia 1 diet with pudding-thick liquids, ice chips ok, meds via alternative means. Full supervision- seat upright 90 degrees, provide oral care before/ after PO intake, monitor alertness, check for oral holding/ pocketing, do not provide more PO intake before pt swallows what is in mouth, small bites at a time (1/2 tsp). It is not clear at this time whether pt will have adequate nutrition- reportedly pt is much more responsive to wife than anyone else, so it is unclear how pt will tolerate meal if wife is unavailable. Provided thorough education to wife on recommendations and continued risk of aspiration. Wife is in agreement with this diet recommendation. Will continue to follow closely for diet tolerance/ consider advancement with mental status improvements.    Aspiration Risk  Severe    Diet Recommendation Dysphagia 1 (Puree);Pudding   Medication Administration: Via alternative means Compensations: Slow rate;Small sips/bites;Check for pocketing;Check for anterior loss    Other  Recommendations Oral Care Recommendations: Oral care before and after PO;Oral care QID Other Recommendations: Order thickener from pharmacy;Have oral suction available;Clarify dietary restrictions   Follow Up Recommendations       Frequency and Duration min 2x/week  2 weeks   Pertinent Vitals/Pain No sign of pain    SLP Swallow Goals     Swallow Study Prior Functional Status       General Other Pertinent Information: Pt is  a 80 year old with PMH of severe dementia, HTN and BPH; who came from SNF where he resides due to seizure activity, admitted 11/2 with post ictal state after initial  seizure activity. This resulted in further deterioration on nuero exam with L facial droop, inability to follow commands and neurologic changes from baseline according to wife. Palliative consulted- poor prognosis, family expressed needing to "give it a few more days"- hopeful for improvement. Pt had bedside swallow eval 11/3 and at that point was minimally responsive to ice chips. No reports of swallow difficulties prior to this admission. SLP signed off 11/4 given likely shift to comfort care. RN ordered repeat BSE today given increased alertness/ responsiveness.  Type of Study: Bedside swallow evaluation Previous Swallow Assessment: BSE 11/3- NPO Diet Prior to this Study: NPO Temperature Spikes Noted: No Respiratory Status: Supplemental O2 delivered via (comment) History of Recent Intubation: No Behavior/Cognition: Alert;Cooperative;Distractible;Requires cueing;Doesn't follow directions Oral Cavity - Dentition: Adequate natural dentition/normal for age Self-Feeding Abilities: Total assist Patient Positioning: Upright in bed Baseline Vocal Quality: Low vocal intensity Volitional Cough: Cognitively unable to elicit Volitional Swallow: Unable to elicit    Oral/Motor/Sensory Function Overall Oral Motor/Sensory Function: Impaired at baseline (unable to assess d/t cognition)   Ice Chips Ice chips: Within functional limits Presentation: Spoon   Thin Liquid Thin Liquid: Not tested    Nectar Thick Nectar Thick Liquid: Impaired Presentation: Spoon Oral Phase Impairments: Reduced lingual movement/coordination;Poor awareness of bolus;Reduced labial seal Oral phase functional implications: Left anterior spillage Pharyngeal Phase Impairments: Suspected delayed Swallow;Wet Vocal Quality   Honey Thick Honey Thick Liquid: Not tested   Puree Puree: Impaired Presentation: Spoon Oral Phase Impairments: Reduced labial seal Oral Phase Functional Implications: Oral residue;Left lateral sulci pocketing;Right  lateral sulci pocketing Pharyngeal Phase Impairments: Suspected delayed Swallow   Solid   [name] [name]    Solid: Not tested       [place], Madelon Welsch K, MA, CCC-SLP 08/28/2015,2:06 PM [phone]

## 2015-08-29 MED ORDER — DILTIAZEM HCL 30 MG PO TABS
15.0000 mg | ORAL_TABLET | Freq: Three times a day (TID) | ORAL | Status: DC
  Administered 2015-08-29: 15 mg via ORAL
  Filled 2015-08-29: qty 1

## 2015-08-29 MED ORDER — DILTIAZEM HCL 30 MG PO TABS
15.0000 mg | ORAL_TABLET | Freq: Three times a day (TID) | ORAL | Status: DC
  Administered 2015-08-29 – 2015-08-31 (×5): 15 mg via ORAL
  Filled 2015-08-29 (×5): qty 1

## 2015-08-29 MED ORDER — DIVALPROEX SODIUM 500 MG PO DR TAB
750.0000 mg | DELAYED_RELEASE_TABLET | Freq: Two times a day (BID) | ORAL | Status: DC
  Filled 2015-08-29 (×3): qty 1

## 2015-08-29 MED ORDER — VALPROATE SODIUM 250 MG/5ML PO SYRP
750.0000 mg | ORAL_SOLUTION | Freq: Two times a day (BID) | ORAL | Status: DC
  Administered 2015-08-29 – 2015-08-30 (×3): 750 mg via ORAL
  Filled 2015-08-29 (×3): qty 15

## 2015-08-29 MED ORDER — HYDRALAZINE HCL 20 MG/ML IJ SOLN
5.0000 mg | Freq: Once | INTRAMUSCULAR | Status: AC
  Administered 2015-08-29: 5 mg via INTRAVENOUS
  Filled 2015-08-29: qty 1

## 2015-08-29 NOTE — Progress Notes (Signed)
SLP Cancellation Note  Patient Details Name: [name] [name] MRN: [id number] DOB: [birth year]   Cancelled treatment:       Reason Eval/Treat Not Completed: Fatigue/lethargy limiting ability to participate   Ogechi Kuehnel,PAT, M.S., CCC-SLP 08/29/2015, 4:42 PM

## 2015-08-29 NOTE — Care Management Important Message (Signed)
Important Message  Patient Details  Name: [name] [name] MRN: [id number] Date of Birth: 01/11/1935   Medicare Important Message Given:  Yes-second notification given    Kyla BalzarineShealy, [name] 08/29/2015, 10:00 AM

## 2015-08-29 NOTE — Progress Notes (Signed)
MD notified of pts BP running in the 150-180 consistently through the night. 5mg  of hydralazine ordered and administered. RN will continue to monitor.

## 2015-08-29 NOTE — Progress Notes (Signed)
PROGRESS NOTE  [name] [name] [id number] DOB: 05/01/1935 DOA: 08/25/2015 PCP: [name], HENRY, MD  HPI: 80 y.o. male with PMH significant for severe dementia, HTN and BPH; who came from SNF where he resides due to seizure activity. Patient was recently taken off depakote (which he was using due to mood disorders). Patient with post ictal state after initial seizure activity. While in ED experienced two episodes of SVT (suppresed by the use of adenosine) and subsequently further deterioration on neurologic exam, with left facial droop, inability to follow commands and neurologic changes from baseline according to wife. CT head negative for acute intracranial abnormalities  Subjective / 24 H Interval events - he appears more alert this morning, eyes open, non verbal  Assessment/Plan: Principal Problem:   Seizure (HCC) Active Problems:   Alzheimer's dementia   BPH (benign prostatic hyperplasia)   SVT (supraventricular tachycardia) (HCC)   Essential hypertension   Facial droop   Hyperglycemia   DNR (do not resuscitate) discussion   Palliative care encounter   Stroke-like symptoms   Failure to thrive in adult   Goals of care, counseling/discussion  Seizure (HCC) with prolonged post-ictal phase - new presentation; patient w/o hx of seizure in the past.  - neurology consulted, restarted on depakote. EEG with generalized slowing c/w Alzheimer, as well as findings of an epileptic disorder with epileptogenic foci involving right and left frontal regions. - MRI without acute findings - UA negative for infection  Goals of care - another long discussion today with patient's wife and daughter. Wife is having difficulties dealing with and feels very guilty regarding feeding. She is asking about an NG tube, temporary to see if he will regain some strength.   Alzheimer's dementia: severe and chronic - patient at baseline wheelchair bound and essentially non verbal - will continue supportive  care[facility] consulted, appreciate input  BPH (benign prostatic hyperplasia) - holding home meds for now  SVT (supraventricular tachycardia) (HCC):  - received adenosine twice while in ED - cardiology consulted, 2D echo as below - on oral cardizem  Essential hypertension - resume oral meds today as his swallowing has improved  Hyperglycemia: no prior hx of diabetes - A1C 5.3  Hypoxia: with concerns for aspiration with seizure - will monitor for now - CXR w/o frank infiltrates - no fever, no elevation on WBC's - will use PRN oxygen supplementation  - respiratory status stable   Diet: DIET - DYS 1 Room service appropriate?: Yes; Fluid consistency:: Pudding Thick Fluids: NS DVT Prophylaxis: heparin  Code Status: DNR Family Communication: discussed with wife and daughter bedside Disposition Plan: TBD  Barriers to discharge: ongoing goals of care discussions  Consultants:  Neurology   Palliative  Cardiology   Procedures:  2D echo Study Conclusions - Left ventricle: The cavity size was normal. Wall thickness wasnormal. Systolic function was normal. The estimated ejectionfraction was in the range of 55% to 60%. Doppler parameters areconsistent with abnormal left ventricular relaxation (grade 1diastolic dysfunction).  Aortic valve: Mildly calcified annulus. Trileaflet. There wastrivial regurgitation. - Aorta: Mild aortic root dilatation. Aortic root dimension: 40 mm(ED). - Mitral valve: Calcified annulus. There was trivial regurgitation. - Right ventricle: The cavity size was mildly dilated. Systolicfunction is low normal to mildly reduced. Annular velocities andTAPSE are over-estimated. - Right atrium: The atrium was mildly dilated. - Tricuspid valve: There was mild regurgitation. - Pulmonary arteries: Systolic pressure was mildly increased. PApeak pressure: 33 mm Hg (S).    Antibiotics  Anti-infectives  None       Studies  No  results found.  Objective  Filed Vitals:   08/29/15 0400 08/29/15 0500 08/29/15 0600 08/29/15 0759  BP: 182/78 172/88 126/69 151/69  Pulse: 78 71 69 64  Temp:    98.8 F (37.1 C)  TempSrc:    Axillary  Resp: Height:      Weight:      SpO2: 98% 99% 98% 99%    Intake/Output Summary (Last 24 hours) at 08/29/15 1133 Last data filed at 08/29/15 0600  Gross per 24 hour  Intake   1780 ml  Output   1225 ml  Net    555 ml   Filed Weights   08/25/15 0859  Weight: 79.379 kg (175 lb)    Exam:  GENERAL: NAD, flushed, non verbal  HEENT: head NCAT, no scleral icterus. Pupils round and reactive.  NECK: Supple.  LUNGS: Clear to auscultation. No wheezing or crackles  HEART: Regular rate and rhythm without murmur. 2+ pulses, no JVD, no peripheral edema  ABDOMEN: Soft, non-distended, non-tender. Positive bowel sounds.  NEUROLOGIC: non verbal, does not follow commands   Data Reviewed: Basic Metabolic Panel:  Recent Labs Lab 08/25/15 0911 08/25/15 1940 08/26/15 0445 08/27/15 0506  NA 137  --  142 145  K 3.7  --  3.9 3.8  CL 104  --  113* 111  CO2 22  --  18* 24  GLUCOSE 146*  --  109* 91  BUN 11  --  17 16  CREATININE 0.91 1.24 1.05 0.79  CALCIUM 9.1  --  8.2* 8.4*  MG  --   --   --  2.1   Liver Function Tests:  Recent Labs Lab 08/26/15 0445  AST 32  ALT 16*  ALKPHOS 71  BILITOT 1.1  PROT 5.2*  ALBUMIN 2.5*   CBC:  Recent Labs Lab 08/25/15 0911 08/25/15 1940 08/26/15 0445 08/27/15 0506  WBC 7.2 19.6* 14.8* 9.1  HGB 16.0 16.6 14.5 12.5*  HCT 47.2 49.3 42.7 37.9*  MCV 92.9 93.7 93.8 95.5  PLT 176 178 159 124*   BNP (last 3 results)  Recent Labs  08/25/15 0911  BNP 27.3   CBG:  Recent Labs Lab 08/25/15 0903 08/27/15 1953 08/28/15 0807 08/28/15 1225  GLUCAP 133* 85 85 77    Recent Results (from the past 240 hour(s))  MRSA PCR Screening     Status: None   Collection Time: 08/25/15  6:55 PM  Result Value Ref Range Status    MRSA by PCR NEGATIVE NEGATIVE Final    Comment:        The GeneXpert MRSA Assay (FDA approved for NASAL specimens only), is one component of a comprehensive MRSA colonization surveillance program. It is not intended to diagnose MRSA infection nor to guide or monitor treatment for MRSA infections.      Scheduled Meds: . antiseptic oral rinse  7 mL Mouth Rinse BID  . aspirin  300 mg Rectal Daily   Or  . aspirin  325 mg Oral Daily  . diltiazem  15 mg Oral 3 times per day  . heparin  5,000 Units Subcutaneous 3 times per day  . valproic acid  750 mg Oral BID   Continuous Infusions: . sodium chloride 75 mL/hr at 08/29/15 1002   Time spent: 25 minutes, more than 50% bedside discussing goals of care.   Pamella Pert, MD [facility] Pager [phone]. If 7 PM - 7 AM,  please contact night-coverage at [email or web address], password [facility] 08/29/2015, 11:33 AM  LOS: 4 days

## 2015-08-30 IMAGING — CT CT CERVICAL SPINE W/O CM
3 of 5 series · 12 of 27 positions shown, 15 images · non-contrast
Comparison: None.

CLINICAL DATA: Pain post trauma

EXAM:
CT HEAD WITHOUT CONTRAST
CT CERVICAL SPINE WITHOUT CONTRAST
TECHNIQUE: Multidetector CT imaging of the head and cervical spine was
performed following the standard protocol without intravenous
contrast. Multiplanar CT image reconstructions of the cervical spine
were also generated.

[Series 4: bone windows · axial · 0.43mm/px · z∈[-110,-65]mm · 2 of 45 slices shown]
[im 15/45  bone]
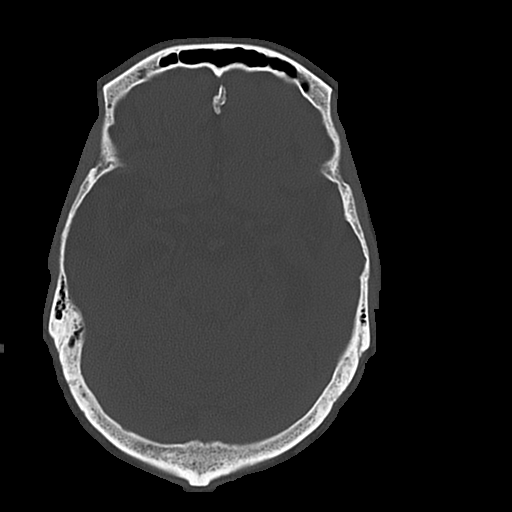
[im 30/45  bone]
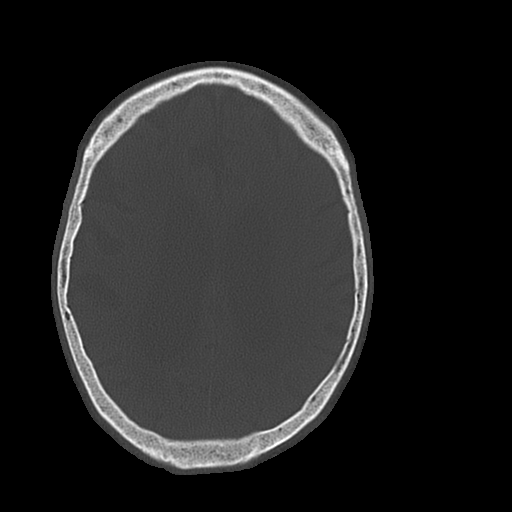

[Series 5: c-spine st · axial · 0.42mm/px · z∈[-254,-150]mm · 5 of 80 slices shown, 7 images]
[im 14/80  soft-tissue]
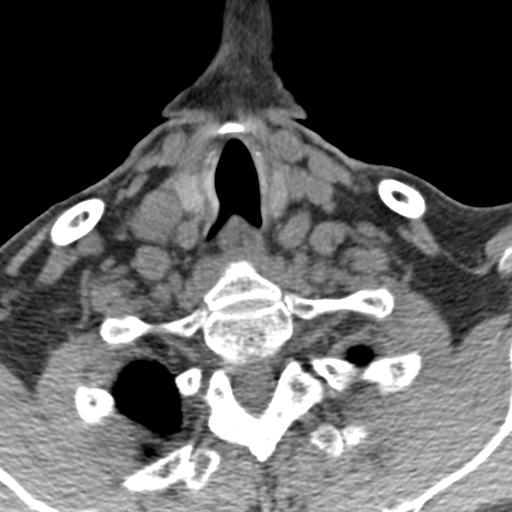
[im 14/80  bone]
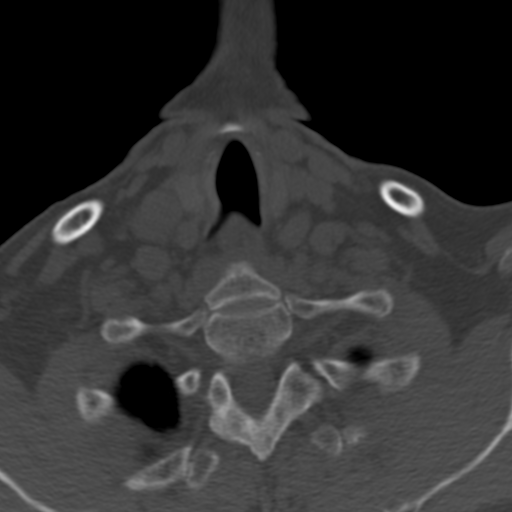
[im 27/80  bone]
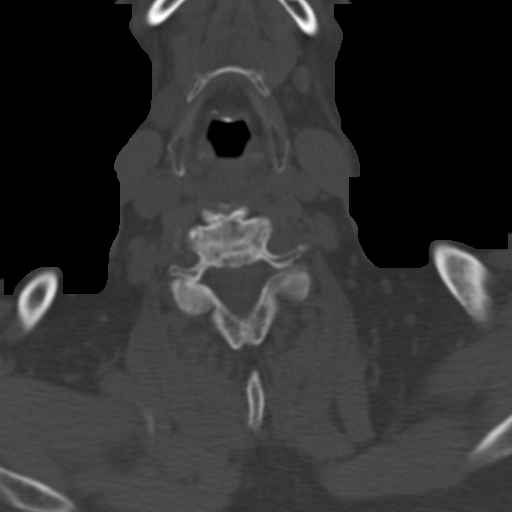
[im 40/80  bone]
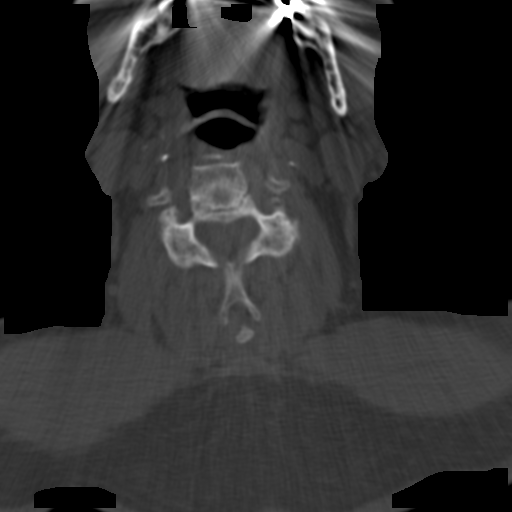
[im 53/80  bone]
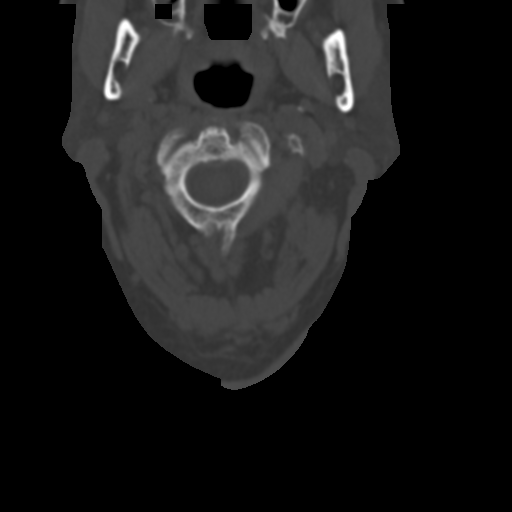
[im 66/80  soft-tissue]
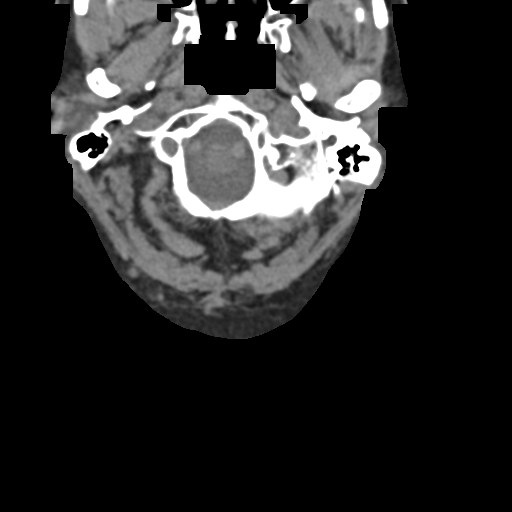
[im 66/80  bone]
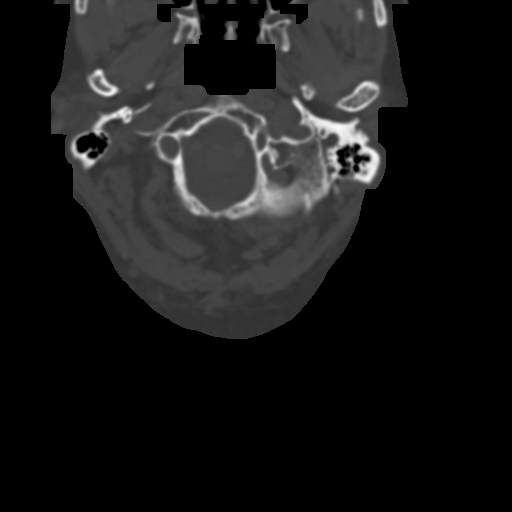

[Series 604: <mpr thick range(2)> · sagittal · 0.42mm/px · 5 of 46 slices shown, 6 images]
[im 16/46  bone]
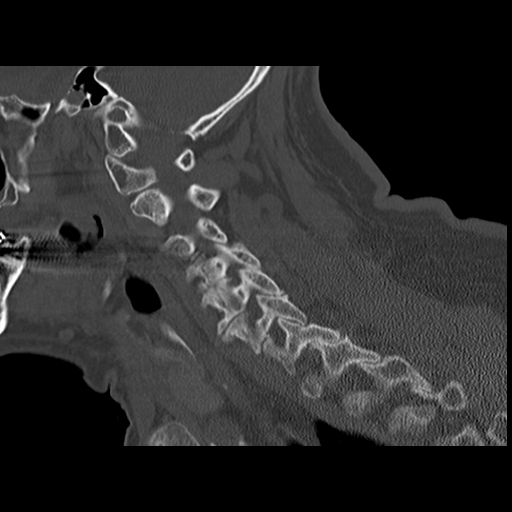
[im 19/46  bone]
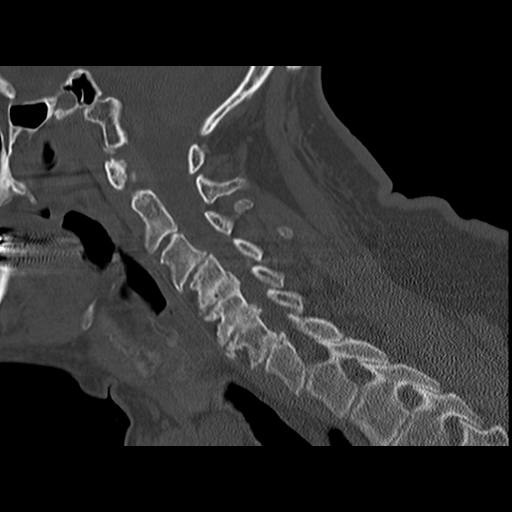
[im 23/46  soft-tissue]
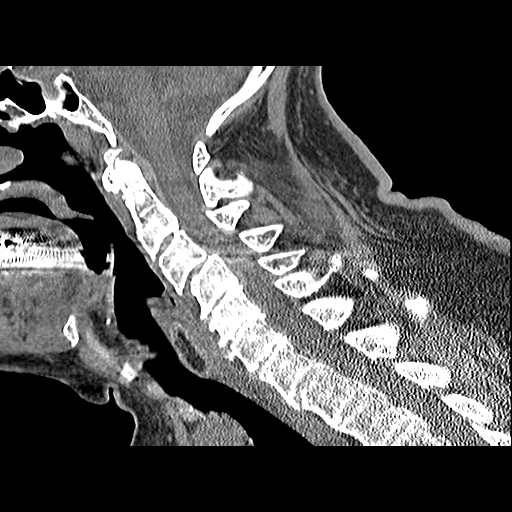
[im 23/46  bone]
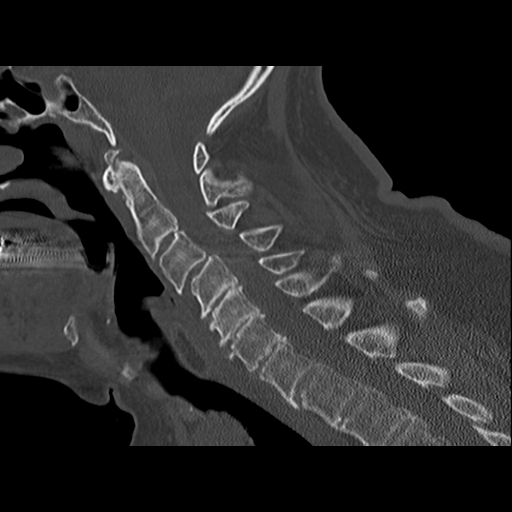
[im 27/46  bone]
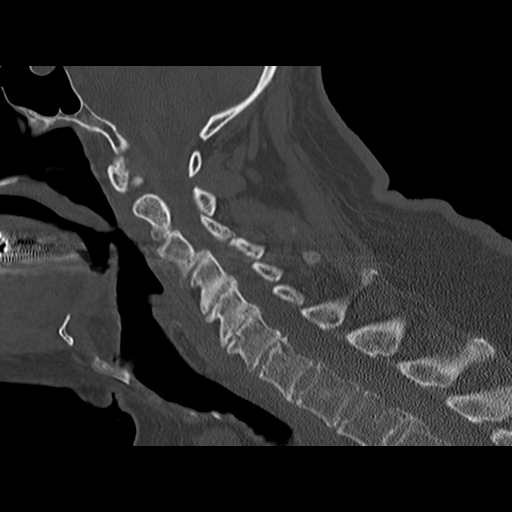
[im 31/46  bone]
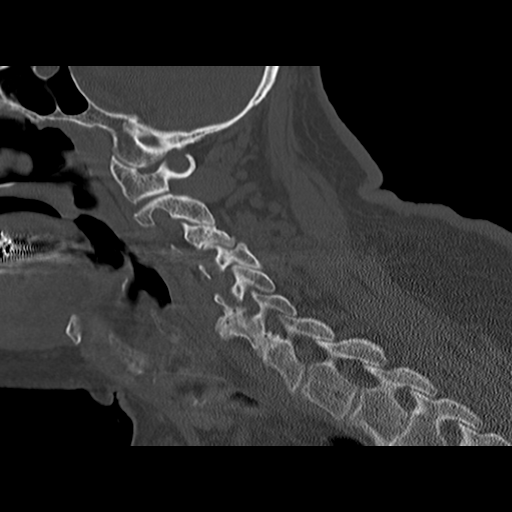

[12 of 27 positions shown; findings below may reference images not displayed]

FINDINGS: CT HEAD FINDINGS

There is moderately severe diffuse atrophy. There is no mass,
hemorrhage, extra-axial fluid collection, or midline shift. There is
small vessel disease throughout the centra semiovale bilaterally. No
acute appearing infarct is seen, however.

Bony calvarium appears intact. The mastoid air cells are clear.
There is debris in both external auditory canals.

CT CERVICAL SPINE FINDINGS

There is no fracture. There is slight anterolisthesis of C3 on C4,
felt to be due to spondylosis. There is no other appreciable
spondylolisthesis. Prevertebral soft tissues and predental space
regions are normal.

There is disc space narrowing, marked, at C4-5, C5-6, and C6-7.
There is moderate disc space narrowing at C3-4 and C7-T1. There is
slightly milder narrowing at C2-3. There is moderate facet
hypertrophy at all levels bilaterally. There is no frank disc
extrusion or stenosis appreciable.

There is a nodular lesion in the left lobe of the thyroid measuring
1.1 x 0.8 cm.
IMPRESSION: CT head: Extensive atrophy with small vessel disease throughout the
centra semiovale bilaterally. There is no intracranial mass,
hemorrhage, or acute appearing infarct. There is probable cerumen in
both external auditory canals.

CT cervical spine: Multilevel osteoarthritic change. Slight
spondylolisthesis is C3-4 is felt to be due to spondylosis. No
fracture apparent.

There is a focal nodular lesion in the left lobe of the thyroid.
Nonemergent thyroid ultrasound advised to further assess.

## 2015-08-30 MED ORDER — DIVALPROEX SODIUM 125 MG PO CSDR
750.0000 mg | DELAYED_RELEASE_CAPSULE | Freq: Two times a day (BID) | ORAL | Status: DC
  Administered 2015-08-30 – 2015-08-31 (×2): 750 mg via ORAL
  Filled 2015-08-30 (×2): qty 6

## 2015-08-30 NOTE — Progress Notes (Signed)
Report called to 23M spoke to [employer]  RN Wife aware of transfer.

## 2015-08-30 NOTE — Care Management Note (Signed)
Case Management Note  Patient Details  Name: [name] [name] MRN: [id number] Date of Birth: 1935/05/16  Subjective/Objective:    Spouse has decided on d/[facility] back [facility] hosp[facility] following.  SNF stay is reimbursed by long-term care policy and private pay, so pt can have medicare hospice benefit @ facility.  Discharging physician will note that pt is to be followed by hospice agency per spouse's wish, medical director of [facility] will make the referral to the hospice agency spouse chooses.  Prov[place] ist [place] agencies [place].                      Expected Discharge Plan:  Skilled Nursing Facility with hospice following  In-House Referral:  Clinical Social Work  Discharge planning Services  CM Consult  Status of Service:  Completed, signed off  Medicare Important Message Given:  Yes-second notification given  Magdalene RiverMayo, Martin Smeal T, RN 08/30/2015, 1:10 PM

## 2015-08-30 NOTE — Progress Notes (Signed)
Daily Progress Note   Patient Name: [name] [name]       Date: 08/30/2015 DOB: 1935/08/12  Age: 80 y.o. MRN#: [id number] Attending Physician: Leatha Gildingostin M Gherghe, MD Primary Care Physician: [name], HENRY, MD Admit Date: 08/25/2015  Reason for Consultation/Follow-up: Establishing goals of care and Psychosocial/spiritual support  Subjective:     - continued discussion  today regarding current medical situation and limitation of life prolonging interventions, natural trajectory of dementia and end of life as it relates to decreased oral intake and natural dehydration  -wife was able to speak to an understanding that her husband would not want "more tubes" and that the focus of care should be comfort and dignity  - again we discussed  concepts specific to artifical feeding and hydration, continued IV antibiotics, rehospitalization and the difference between a aggressive medical intervention path  and a palliative comfort care path for this patient at this time was had.    - Mrs Ladona RidgelRatliff was able to verbalize decision that the best thing for her husband is to focus on comfort and quality and return to facility with hospice in place.  "I do not want him to go through this again"       Questions and concerns addressed.   -PMT will continue to support holistically.   Length of Stay: 5 days  Current Medications: Scheduled Meds:  . antiseptic oral rinse  7 mL Mouth Rinse BID  . aspirin  300 mg Rectal Daily   Or  . aspirin  325 mg Oral Daily  . diltiazem  15 mg Oral 3 times per day  . heparin  5,000 Units Subcutaneous 3 times per day  . valproic acid  750 mg Oral BID    Continuous Infusions: . sodium chloride 75 mL/hr at 08/30/15 0021    PRN Meds: acetaminophen **OR** acetaminophen  Palliative Performance Scale:  20 %     Vital Signs: BP 146/78 mmHg  Pulse 60  Temp(Src) 98.2 F (36.8 C) (Axillary)  Resp 14  Ht 5\' 10"  (1.778 m)  Wt 79.379 kg (175 lb)  BMI 25.11 kg/m2  SpO2  99% SpO2: SpO2: 99 % O2 Device: O2 Device: Nasal Cannula O2 Flow Rate: O2 Flow Rate (L/min): 2 L/min  Intake/output summary:   Intake/Output Summary (Last 24 hours) at 08/30/15 1204 Last data filed at 08/30/15 0730  Gross per 24 hour  Intake 1376.25 ml  Output    650 ml  Net 726.25 ml   LBM: Last BM Date: 08/29/15 (smears ) Baseline Weight: Weight: 79.379 kg (175 lb) Most recent weight: Weight: 79.379 kg (175 lb)  Physical Exam:              General: ill appearing, at times makes eye contact with wife  HEENT: moist buccal membranes, no exudate CVS: RRR Resp: decreased in bases Abd: soft NT Skin: warm and dry Neuro: minimally responsive to gentle touch and verbal stimuli   Additional Data Reviewed: No results for input(s): WBC, HGB, PLT, NA, BUN, CREATININE in the last 72 hours.  Invalid input(s): ALB   Problem List:  Patient Active Problem List   Diagnosis Date Noted  . Goals of care, counseling/discussion   . Failure to thrive in adult   . DNR (do not resuscitate) discussion 08/26/2015  . Palliative care encounter 08/26/2015  . Stroke-like symptoms   . Seizure (HCC) 08/25/2015  . SVT (supraventricular tachycardia) (HCC) 08/25/2015  . Essential hypertension 08/25/2015  . Facial droop 08/25/2015  . Hyperglycemia 08/25/2015  .  Major depression, chronic (HCC) 06/29/2015  . Hip fracture (HCC) 11/29/2013  . Closed left hip fracture (HCC) 11/28/2013  . Alzheimer's dementia 11/28/2013  . Depression 11/28/2013  . Anxiety 11/28/2013  . HLD (hyperlipidemia) 11/28/2013  . BPH (benign prostatic hyperplasia) 11/28/2013  . Abnormal EKG 11/28/2013     Palliative Care Assessment & Plan    Code Status:  DNR-comfort is main focus of care  Goals of Care:   Focus of care is comfort and dignity.  Wife hopes for "good care" for her husband and tells me that she thinks the best thing for him is to return to the SNF with hospice benefit in place.  Continue IV fluids  until discharge    5. Discharge Planning: SNF with hospice in place   Care plan was discussed with  Dr Lafe Garin  Thank you for allowing the Palliative Medicine Team to assist in the care of this patient.   Time In:  1145 Time Out: 1220 Total Time 35 min Prolonged Time Billed  no     Greater than 50%  of this time was spent counseling and coordinating care related to the above assessment and plan.     Canary Brim, NP  08/30/2015, 12:04 PM  Please contact Palliative Medicine Team phone at [phone] for questions and concerns.

## 2015-08-30 NOTE — Progress Notes (Signed)
PROGRESS NOTE  [name] [name] GNF:[id number] DOB: Feb 21, 1935 DOA: 08/25/2015 PCP: [name], HENRY, MD  HPI: 80 y.o. male with PMH significant for severe dementia, HTN and BPH; who came from SNF where he resides due to seizure activity. Patient was recently taken off depakote (which he was using due to mood disorders). Patient with post ictal state after initial seizure activity. While in ED experienced two episodes of SVT (suppresed by the use of adenosine) and subsequently further deterioration on neurologic exam, with left facial droop, inability to follow commands and neurologic changes from baseline according to wife. CT head negative for acute intracranial abnormalities  Subjective / 24 H Interval events - sleeping when I entered room, opens eyes when prompted but non verbal  Assessment/Plan: Principal Problem:   Seizure (HCC) Active Problems:   Alzheimer's dementia   BPH (benign prostatic hyperplasia)   SVT (supraventricular tachycardia) (HCC)   Essential hypertension   Facial droop   Hyperglycemia   DNR (do not resuscitate) discussion   Palliative care encounter   Stroke-like symptoms   Failure to thrive in adult   Goals of care, counseling/discussion  Seizure (HCC) with prolonged post-ictal phase - new presentation; patient w/o hx of seizure in the past.  - neurology consulted, restarted on depakote. EEG with generalized slowing c/w Alzheimer, as well as findings of an epileptic disorder with epileptogenic foci involving right and left frontal regions. - MRI without acute findings - UA negative for infection - slow improvement  Goals of care - discussed with wife bedside, she has confirmed no feeding tube and allow food as much as he tolerates. So far he is having some po intake and is taking his medications.   Alzheimer's dementia: severe and chronic - patient at baseline wheelchair bound and essentially non verbal - will continue supportive care[facility]  palliative consulted, appreciate input  BPH (benign prostatic hyperplasia) - holding home meds for now  SVT (supraventricular tachycardia) (HCC):  - received adenosine twice while in ED - cardiology consulted, 2D echo as below - on oral cardizem  Essential hypertension - resumed oral meds as his swallowing has improved  Hyperglycemia: no prior hx of diabetes - A1C 5.3  Hypoxia: with concerns for aspiration with seizure - will monitor for now - CXR w/o frank infiltrates - no fever, no elevation on WBC's - will use PRN oxygen supplementation  - respiratory status stable   Diet: DIET - DYS 1 Room service appropriate?: Yes; Fluid consistency:: Pudding Thick Fluids: NS DVT Prophylaxis: heparin  Code Status: DNR Family Communication: discussed with wife bedside Disposition Plan: SNF 1-2 days, transfer to tele today  Barriers to discharge: ongoing goals of care discussions  Consultants:  Neurology   Palliative  Cardiology   Procedures:  2D echo Study Conclusions - Left ventricle: The cavity size was normal. Wall thickness wasnormal. Systolic function was normal. The estimated ejectionfraction was in the range of 55% to 60%. Doppler parameters areconsistent with abnormal left ventricular relaxation (grade 1diastolic dysfunction).  Aortic valve: Mildly calcified annulus. Trileaflet. There wastrivial regurgitation. - Aorta: Mild aortic root dilatation. Aortic root dimension: 40 mm(ED). - Mitral valve: Calcified annulus. There was trivial regurgitation. - Right ventricle: The cavity size was mildly dilated. Systolicfunction is low normal to mildly reduced. Annular velocities andTAPSE are over-estimated. - Right atrium: The atrium was mildly dilated. - Tricuspid valve: There was mild regurgitation. - Pulmonary arteries: Systolic pressure was mildly increased. PApeak pressure: 33 mm Hg (S).    Antibiotics  Anti-infectives    None       Studies  No  results found.  Objective  Filed Vitals:   08/30/15 0014 08/30/15 0422 08/30/15 0519 08/30/15 0729  BP: 134/87 163/85 144/58 146/78  Pulse: 65 64  60  Temp: 98.5 F (36.9 C) 98.2 F (36.8 C)  98.2 F (36.8 C)  TempSrc: Axillary Axillary  Axillary  Resp: Height:      Weight:      SpO2: 97% 99%  99%    Intake/Output Summary (Last 24 hours) at 08/30/15 1009 Last data filed at 08/30/15 0730  Gross per 24 hour  Intake 1376.25 ml  Output    650 ml  Net 726.25 ml   Filed Weights   08/25/15 0859  Weight: 79.379 kg (175 lb)    Exam:  GENERAL: NAD, flushed, non verbal  HEENT: head NCAT, no scleral icterus. Pupils round and reactive.  NECK: Supple.  LUNGS: Clear to auscultation. No wheezing or crackles  HEART: Regular rate and rhythm without murmur. 2+ pulses, no JVD, no peripheral edema  ABDOMEN: Soft, non-distended, non-tender. Positive bowel sounds.  NEUROLOGIC: non verbal, does not follow commands   Data Reviewed: Basic Metabolic Panel:  Recent Labs Lab 08/25/15 0911 08/25/15 1940 08/26/15 0445 08/27/15 0506  NA 137  --  142 145  K 3.7  --  3.9 3.8  CL 104  --  113* 111  CO2 22  --  18* 24  GLUCOSE 146*  --  109* 91  BUN 11  --  17 16  CREATININE 0.91 1.24 1.05 0.79  CALCIUM 9.1  --  8.2* 8.4*  MG  --   --   --  2.1   Liver Function Tests:  Recent Labs Lab 08/26/15 0445  AST 32  ALT 16*  ALKPHOS 71  BILITOT 1.1  PROT 5.2*  ALBUMIN 2.5*   CBC:  Recent Labs Lab 08/25/15 0911 08/25/15 1940 08/26/15 0445 08/27/15 0506  WBC 7.2 19.6* 14.8* 9.1  HGB 16.0 16.6 14.5 12.5*  HCT 47.2 49.3 42.7 37.9*  MCV 92.9 93.7 93.8 95.5  PLT 176 178 159 124*   BNP (last 3 results)  Recent Labs  08/25/15 0911  BNP 27.3   CBG:  Recent Labs Lab 08/25/15 0903 08/27/15 1953 08/28/15 0807 08/28/15 1225  GLUCAP 133* 85 85 77    Recent Results (from the past 240 hour(s))  MRSA PCR Screening     Status: None   Collection Time:  08/25/15  6:55 PM  Result Value Ref Range Status   MRSA by PCR NEGATIVE NEGATIVE Final    Comment:        The GeneXpert MRSA Assay (FDA approved for NASAL specimens only), is one component of a comprehensive MRSA colonization surveillance program. It is not intended to diagnose MRSA infection nor to guide or monitor treatment for MRSA infections.      Scheduled Meds: . antiseptic oral rinse  7 mL Mouth Rinse BID  . aspirin  300 mg Rectal Daily   Or  . aspirin  325 mg Oral Daily  . diltiazem  15 mg Oral 3 times per day  . heparin  5,000 Units Subcutaneous 3 times per day  . valproic acid  750 mg Oral BID   Continuous Infusions: . sodium chloride 75 mL/hr at 08/30/15 0021   Time spent: 15 minutes, more than 50% bedside re-discussing goals of care with wife   Pamella Pert, MD [facility]  Pager [phone]. If 7 PM - 7 AM, please contact night-coverage at [email or web address], password [email or web address] 08/30/2015, 10:09 AM  LOS: 5 days

## 2015-08-30 NOTE — Progress Notes (Signed)
Speech Language Pathology Treatment: Dysphagia  Patient Details Name: [name] [name] MRN: [id number] DOB: May 17, 1935 Today's Date: 08/30/2015 Time: [id number] SLP Time Calculation (min) (ACUTE ONLY): 18 min  Assessment / Plan / Recommendation Clinical Impression  Observed wife feeding pt who is alert with head mildly below optimal position. SLP demonstrated safer position for head of bed up and reverse trendelenburg position. Min-mild lingual manipulation and mandibular excursion with lingual residue and pocketing demonstrated dry spoon strategy to initiate swallow. Multiple swallows observed and suspected swallow delay. Subtle sign airway compromise of watery eyes. No cough or throat clear however suspect he is intermittently silently aspirating and reiterated this with wife who will need continued discussions re: progression of dysphagia with advanced dementia.       HPI Other Pertinent Information: Pt is a 80 year old with PMH of severe dementia, HTN and BPH; who came from SNF where he resides due to seizure activity, admitted 11/2 with post ictal state after initial seizure activity. This resulted in further deterioration on nuero exam with L facial droop, inability to follow commands and neurologic changes from baseline according to wife. Palliative consulted- poor prognosis, family expressed needing to "give it a few more days"- hopeful for improvement. Pt had bedside swallow eval 11/3 and at that point was minimally responsive to ice chips. No reports of swallow difficulties prior to this admission. SLP signed off 11/4 given likely shift to comfort care. RN ordered repeat BSE today given increased alertness/ responsiveness.    Pertinent Vitals Pain Assessment: No/denies pain  SLP Plan  Continue with current plan of care    Recommendations Diet recommendations: Dysphagia 1 (puree);Pudding-thick liquid Liquids provided via:  (n/a) Medication Administration: Crushed with puree Supervision:  Trained caregiver to feed patient;Full supervision/cueing for compensatory strategies Compensations: Slow rate;Small sips/bites;Check for pocketing;Check for anterior loss Postural Changes and/or Swallow Maneuvers: Seated upright 90 degrees              Oral Care Recommendations: Oral care before and after PO;Oral care QID Follow up Recommendations:  (tbd) Plan: Continue with current plan of care    GO     [name] [name], [name] [name] 08/30/2015, 10:06 AM   Breck CoonsLisa [name] Lonell FaceLitaker M.Ed [employer] Pager [phone]

## 2015-08-30 NOTE — Progress Notes (Signed)
Pt transferred per bed by Nurse tech to 46M bed 19 with all belongings. Wife aware. Pt not able to respond to commands . Taking small amts of pureed food. Total care Condom cath leaked pericare done and condom cath replaced .

## 2015-08-31 IMAGING — RF DG FEMUR 2V*L*
1 series · 5 of 5 positions shown · non-contrast
Comparison: Preoperative study 11/28/2013.

FLUOROSCOPY TIME:  1 minute and 4 seconds.

CLINICAL DATA: 78-year-old male undergoing left proximal femur
repair. Initial encounter.

EXAM:
LEFT FEMUR - 2 VIEW; DG C-ARM 1-60 MIN - NRPT [facility]

[Series 1: run · 5 of 5 slices shown]
[im 1/5]
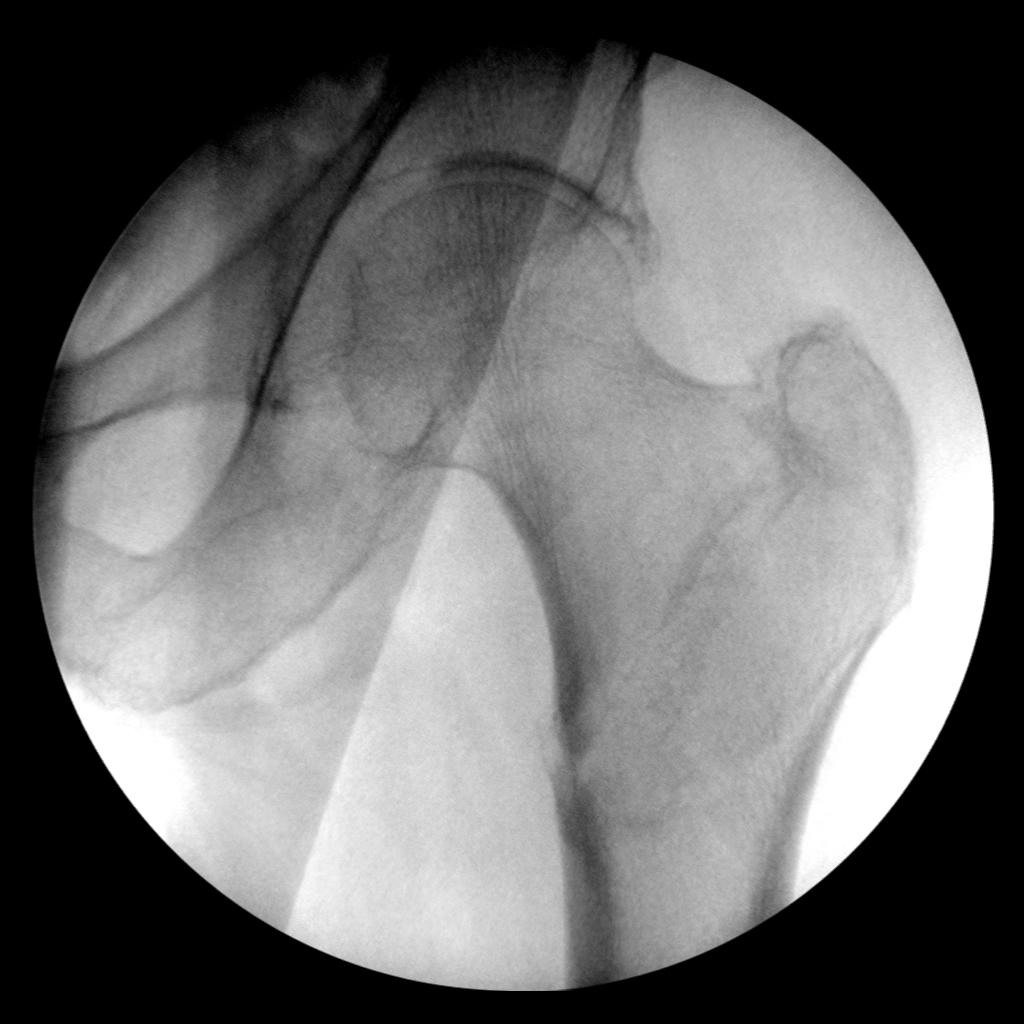
[im 2/5]
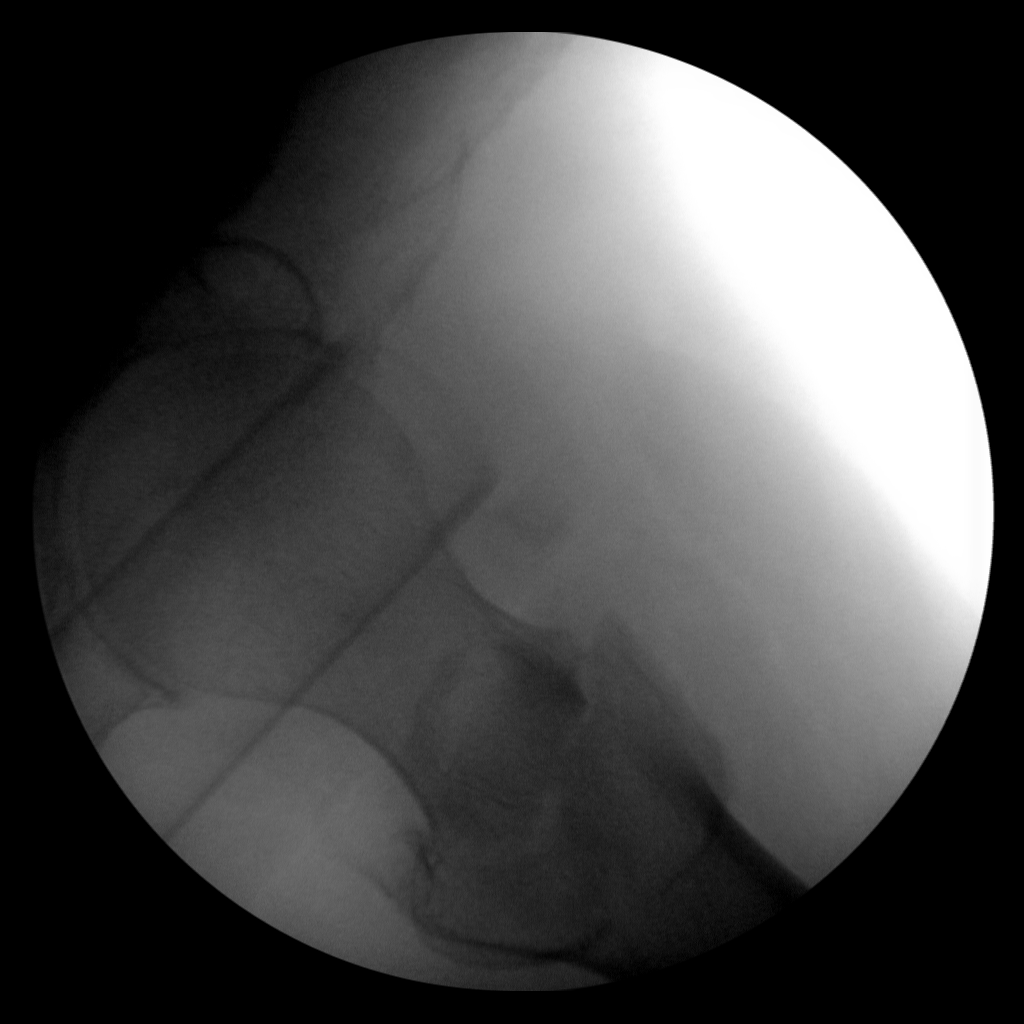
[im 3/5]
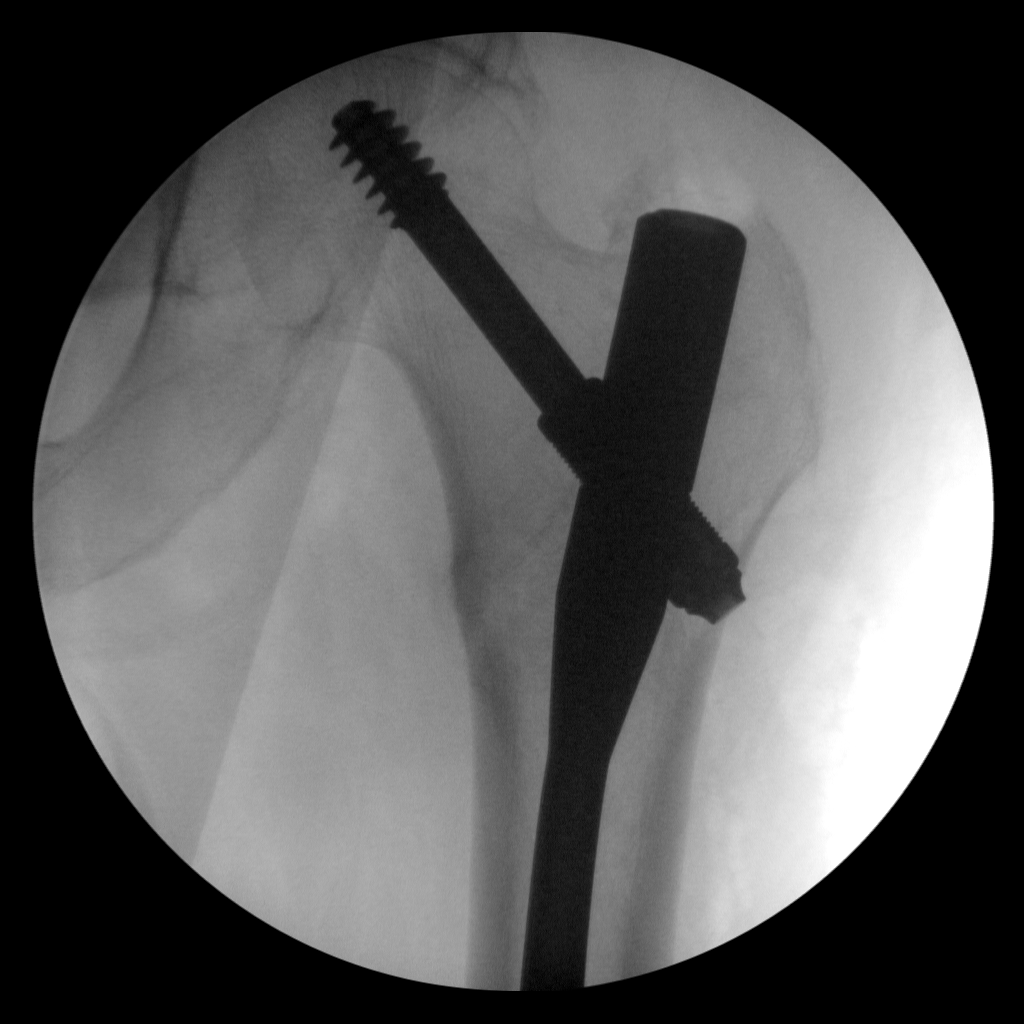
[im 4/5]
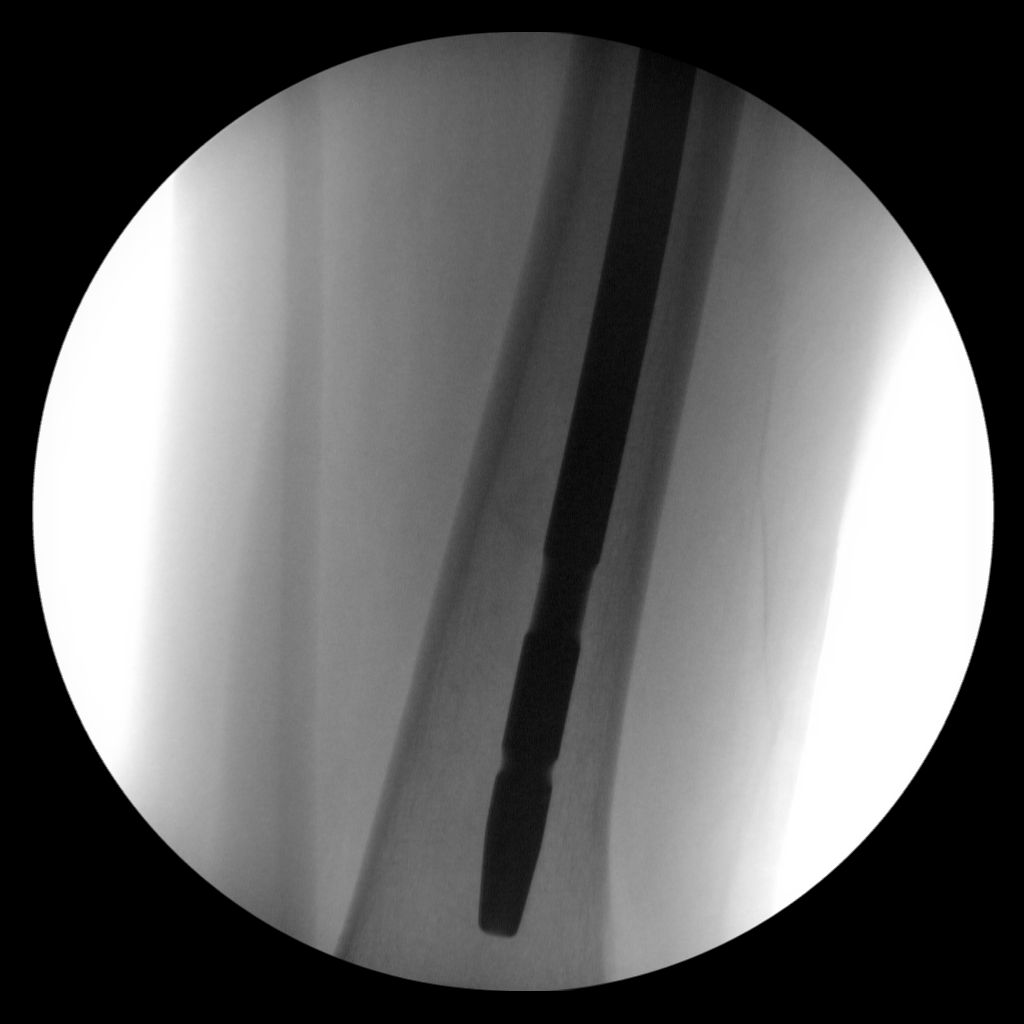
[im 5/5]
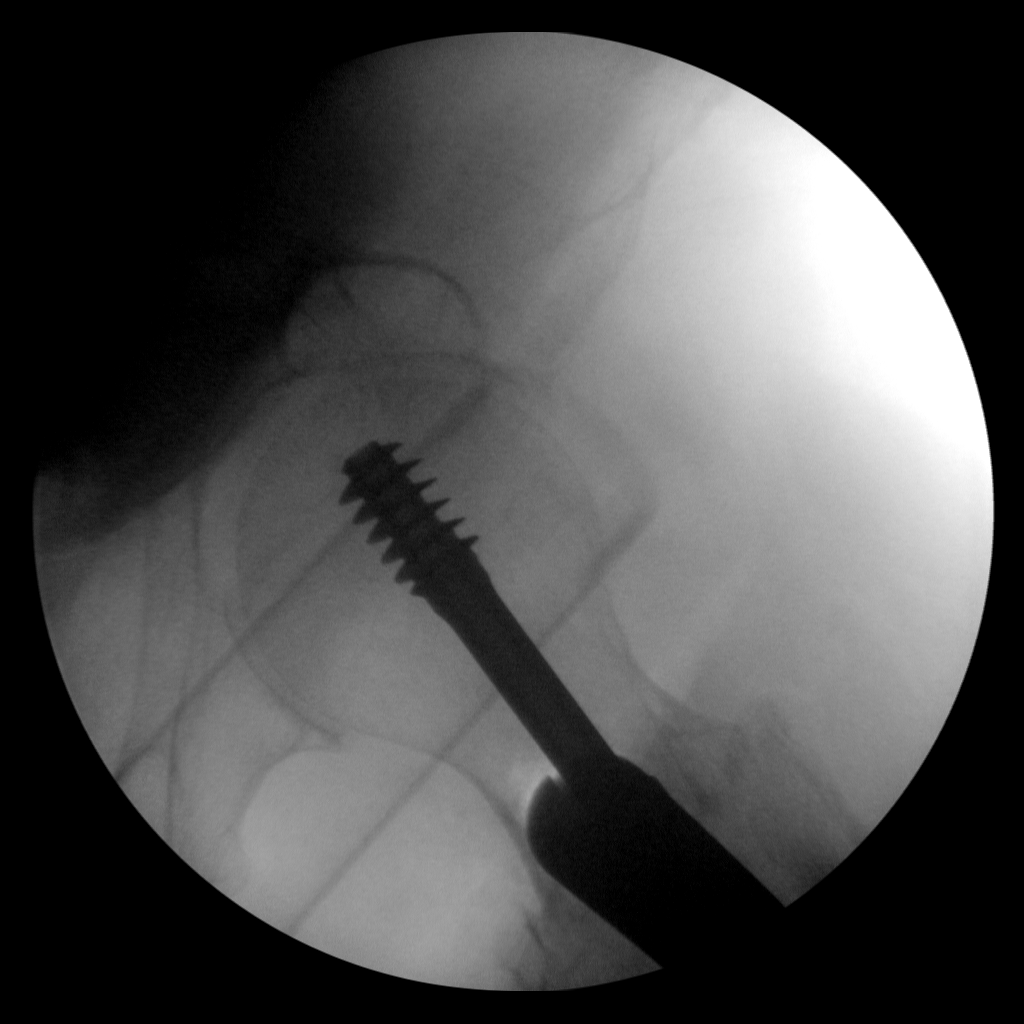

[5 of 5 positions shown; findings below may reference images not displayed]

FINDINGS: Five intraoperative fluoroscopic views demonstrate left femur intra
medullary rod placement with proximal interlocking dynamic hip
screw. Near anatomic alignment about the comminuted
intertrochanteric fracture.
IMPRESSION: Left proximal femur ORIF with no adverse features.

## 2015-08-31 MED ORDER — DILTIAZEM HCL 30 MG PO TABS: 15.0000 mg | ORAL_TABLET | Freq: Three times a day (TID) | ORAL | Status: AC

## 2015-08-31 MED ORDER — ALPRAZOLAM 0.25 MG PO TABS: 0.1250 mg | ORAL_TABLET | Freq: Two times a day (BID) | ORAL | Status: AC

## 2015-08-31 MED ORDER — DIVALPROEX SODIUM 125 MG PO CSDR: 750.0000 mg | DELAYED_RELEASE_CAPSULE | Freq: Two times a day (BID) | ORAL | Status: AC

## 2015-08-31 MED ORDER — ALPRAZOLAM 0.5 MG PO TABS: 0.5000 mg | ORAL_TABLET | Freq: Three times a day (TID) | ORAL | Status: AC | PRN

## 2015-08-31 NOTE — Discharge Summary (Signed)
Physician Discharge Summary  [name] [name] ZOX:[id number] DOB: November 06, 1934 DOA: 08/25/2015  PCP: Florentina Jenny, MD  Admit date: 08/25/2015 Discharge date: 08/31/2015  Time spent: > 30 minutes  Recommendations for Outpatient Follow-up:  1. Follow up with PCP in 2-3 weeks 2. Follow up with hospice services  Discharge Diagnoses:  Principal Problem:   Seizure (HCC) Active Problems:   Alzheimer's dementia   BPH (benign prostatic hyperplasia)   SVT (supraventricular tachycardia) (HCC)   Essential hypertension   Facial droop   Hyperglycemia   DNR (do not resuscitate) discussion   Palliative care encounter   Stroke-like symptoms   Failure to thrive in adult   Goals of care, counseling/discussion  Discharge Condition: stable  Diet recommendation: as tolerated, Dysphagia 1 (puree);Pudding-thick liquid  Filed Weights   08/25/15 0859  Weight: 79.379 kg (175 lb)    History of present illness:  See H&P, Labs, Consult and Test reports for all details in brief, patient is a 80 y.o. male with PMH significant for severe dementia, HTN and BPH; who came from SNF where he resides due to seizure activity.   Hospital Course:  Seizure [facility]) with prolonged post-ictal phase - new presentation; patient w/o hx of seizure in the past. Neurology consulted, restarted on depakote. EEG with generalized slowing c/w Alzheimer, as well as findings of an epileptic disorder with epileptogenic foci involving right and left frontal regions. MRI without acute findings. Neurology consulted and have followed patient while hospitalized. UA negative for infection Goals of care - multiple discussions throughout his hospitalization, DNR/DNI, no artificial feeding / feeding tubes, allow diet for comfort, medications as tolerated. Follow up with hospice services. Alzheimer's dementia: severe and chronic - patient at baseline wheelchair bound and essentially non verbal BPH (benign prostatic hyperplasia) - resume home  medications SVT (supraventricular tachycardia) (HCC) - received adenosine twice while in ED, cardiology consulted, 2D echo as below, started on cardizem with improvement.  Essential hypertension - cardizem, monitor further as an outpatient Hypoxia: with concerns for aspiration with seizure, CXR w/o frank infiltrates, no fever. Stable  Procedures:  2D echo  Study Conclusions - Left ventricle: The cavity size was normal. Wall thickness wasnormal. Systolic function was normal. The estimated ejectionfraction was in the range of 55% to 60%. Doppler parameters areconsistent with abnormal left ventricular relaxation (grade 1diastolic dysfunction). - Aortic valve: Mildly calcified annulus. Trileaflet. There wastrivial regurgitation. - Aorta: Mild aortic root dilatation. Aortic root dimension: 40 mm(ED). - Mitral valve: Calcified annulus. There was trivial regurgitation. - Right ventricle: The cavity size was mildly dilated. Systolicfunction is low normal to mildly reduced. Annular velocities and TAPSE are over-estimated. - Right atrium: The atrium was mildly dilated. - Tricuspid valve: There was mild regurgitation. - Pulmonary arteries: Systolic pressure was mildly increased. PApeak pressure: 33 mm Hg (S).  Consultations:  Cardiology   Palliative care  Discharge Exam: Filed Vitals:   08/30/15 2135 08/31/15 0122 08/31/15 0514 08/31/15 1031  BP: 138/76 154/69 139/67 132/59  Pulse: 73 67 67 64  Temp: 98.6 F (37 C) 98.8 F (37.1 C) 98.6 F (37 C) 98.5 F (36.9 C)  TempSrc: Axillary Axillary Axillary Axillary  Resp: 16 18 16 18   Height:      Weight:      SpO2: 98% 96% 98% 98%    General: NAD, non verbal Cardiovascular: RRR Respiratory: CTA biL  Discharge Instructions Activity:  As tolerated   Get Medicines reviewed and adjusted: Please take all your medications with you for your next visit  with your Primary MD  Please request your Primary MD to go over all hospital  tests and procedure/radiological results at the follow up, please ask your Primary MD to get all Hospital records sent to his/her office.  If you experience worsening of your admission symptoms, develop shortness of breath, life threatening emergency, suicidal or homicidal thoughts you must seek medical attention immediately by calling 911 or calling your MD immediately if symptoms less severe.  You must read complete instructions/literature along with all the possible adverse reactions/side effects for all the Medicines you take and that have been prescribed to you. Take any new Medicines after you have completely understood and accpet all the possible adverse reactions/side effects.   Do not drive when taking Pain medications.   Do not take more than prescribed Pain, Sleep and Anxiety Medications  Special Instructions: If you have smoked or chewed Tobacco in the last 2 yrs please stop smoking, stop any regular Alcohol and or any Recreational drug use.  Wear Seat belts while driving.  Please note  You were cared for by a hospitalist during your hospital stay. Once you are discharged, your primary care physician will handle any further medical issues. Please note that NO REFILLS for any discharge medications will be authorized once you are discharged, as it is imperative that you return to your primary care physician (or establish a relationship with a primary care physician if you do not have one) for your aftercare needs so that they can reassess your need for medications and monitor your lab values.    Medication List    STOP taking these medications        amLODipine 5 MG tablet  Commonly known as:  NORVASC      TAKE these medications        acetaminophen 650 MG CR tablet  Commonly known as:  TYLENOL  Take 650 mg by mouth every 4 (four) hours as needed for pain.     ALPRAZolam 0.25 MG tablet  Commonly known as:  XANAX  Take 0.5 tablets (0.125 mg total) by mouth 2 (two) times  daily. For mood disorder     ALPRAZolam 0.5 MG tablet  Commonly known as:  XANAX  Take 1 tablet (0.5 mg total) by mouth every 8 (eight) hours as needed for anxiety (For severe anxiety only).     aspirin EC 325 MG tablet  Take 1 tablet (325 mg total) by mouth daily.     cholecalciferol 1000 UNITS tablet  Commonly known as:  VITAMIN D  Take 1,000 Units by mouth daily.     citalopram 10 MG tablet  Commonly known as:  CELEXA  Take 10 mg by mouth daily. For depression     diltiazem 30 MG tablet  Commonly known as:  CARDIZEM  Take 0.5 tablets (15 mg total) by mouth every 8 (eight) hours.     divalproex 125 MG capsule  Commonly known as:  DEPAKOTE SPRINKLE  Take 6 capsules (750 mg total) by mouth every 12 (twelve) hours.     doxazosin 4 MG tablet  Commonly known as:  CARDURA  Take 4 mg by mouth at bedtime.     finasteride 5 MG tablet  Commonly known as:  PROSCAR  Take 5 mg by mouth daily.     furosemide 20 MG tablet  Commonly known as:  LASIX  Take 20 mg by mouth daily as needed for edema.     QUEtiapine 25 MG tablet  Commonly known as:  SEROQUEL  Take 75 mg by mouth at bedtime.           Follow-up Information    Follow up with Oneal Grout, MD.   Specialty:  Internal Medicine   Contact information:   [address] [place] [address] [phone]       The results of significant diagnostics from this hospitalization (including imaging, microbiology, ancillary and laboratory) are listed below for reference.    Significant Diagnostic Studies: Ct Head Wo Contrast  08/25/2015  CLINICAL DATA:  Seizure, dementia EXAM: CT HEAD WITHOUT CONTRAST TECHNIQUE: Contiguous axial images were obtained from the base of the skull through the vertex without intravenous contrast. COMPARISON:  11/28/2013 FINDINGS: No skull fracture is noted. No intracranial hemorrhage, mass effect or midline shift. Paranasal sinuses and mastoid air cells are unremarkable. Extensive cerebral  atrophy again noted. Extensive periventricular and subcortical chronic white matter disease. Ventriculomegaly is stable from prior exam. No acute cortical infarction. No mass lesion is noted on this unenhanced scan. IMPRESSION: No acute intracranial abnormality. Stable atrophy and extensive chronic white matter disease. No definite acute cortical infarction. Electronically Signed   By: Natasha Mead M.D.   On: 08/25/2015 10:17   [name]  08/26/2015  CLINICAL DATA:  Patient with severe dementia who presents with seizure activity. Patient recently withdrawn from Depakote which was given for mood disorder. Also LEFT facial droop and inability to follow commands. EXAM: MRI HEAD WITHOUT CONTRAST TECHNIQUE: Multiplanar, multiecho pulse sequences of the brain and surrounding structures were obtained without intravenous contrast. COMPARISON:  CT head 08/25/2015. FINDINGS: The patient was unable to remain motionless for the exam. Small or subtle lesions could be overlooked. The study was prematurely truncated due to lack of patient cooperation. The images which are obtained are overall diagnostic. Global atrophy. Hydrocephalus ex vacuo. Extreme white matter disease and extensive cortical atrophy. No restricted diffusion. No visible acute or chronic hemorrhage, mass lesion, or extra-axial fluid. Flow voids are maintained. BILATERAL cataract extraction. No acute sinus disease. RIGHT mastoid fluid. Compared with recent CT, the appearance is similar. IMPRESSION: Motion degraded and prematurely truncated exam. No acute intracranial findings. No features to suggest acute intracranial process. No restricted diffusion to suggest recent ictal phenomenon. Electronically Signed   By: Elsie Stain M.D.   On: 08/26/2015 08:56   Dg Chest Portable 1 View  08/25/2015  CLINICAL DATA:  Decreased oxygen saturation. EXAM: PORTABLE CHEST 1 VIEW COMPARISON:  November 28, 2013 FINDINGS: There is mild interstitial edema. Heart is  upper normal in size with pulmonary vascularity within normal limits. No airspace consolidation. No adenopathy. There is atherosclerotic calcification in the aorta. There is degenerative change in the thoracic spine. IMPRESSION: Mild interstitial edema. Question a degree of congestive heart failure. No airspace consolidation. Heart size within normal limits. Electronically Signed   By: Bretta Bang III M.D.   On: 08/25/2015 11:22    Microbiology: Recent Results (from the past 240 hour(s))  MRSA PCR Screening     Status: None   Collection Time: 08/25/15  6:55 PM  Result Value Ref Range Status   MRSA by PCR NEGATIVE NEGATIVE Final    Comment:        The GeneXpert MRSA Assay (FDA approved for NASAL specimens only), is one component of a comprehensive MRSA colonization surveillance program. It is not intended to diagnose MRSA infection nor to guide or monitor treatment for MRSA infections.     Labs: Basic Metabolic Panel:  Recent Labs  Lab 08/25/15 0911 08/25/15 1940 08/26/15 0445 08/27/15 0506  NA 137  --  142 145  K 3.7  --  3.9 3.8  CL 104  --  113* 111  CO2 22  --  18* 24  GLUCOSE 146*  --  109* 91  BUN 11  --  17 16  CREATININE 0.91 1.24 1.05 0.79  CALCIUM 9.1  --  8.2* 8.4*  MG  --   --   --  2.1   Liver Function Tests:  Recent Labs Lab 08/26/15 0445  AST 32  ALT 16*  ALKPHOS 71  BILITOT 1.1  PROT 5.2*  ALBUMIN 2.5*    Recent Labs Lab 08/27/15 1037  AMMONIA 26   CBC:  Recent Labs Lab 08/25/15 0911 08/25/15 1940 08/26/15 0445 08/27/15 0506  WBC 7.2 19.6* 14.8* 9.1  HGB 16.0 16.6 14.5 12.5*  HCT 47.2 49.3 42.7 37.9*  MCV 92.9 93.7 93.8 95.5  PLT 176 178 159 124*   BNP: BNP (last 3 results)  Recent Labs  08/25/15 0911  BNP 27.3   CBG:  Recent Labs Lab 08/25/15 0903 08/27/15 1953 08/28/15 0807 08/28/15 1225  GLUCAP 133* 85 85 77    Signed:  Tashay Bozich  [facility] 08/31/2015, 11:07 AM

## 2015-08-31 NOTE — NC FL2 (Signed)
[place] MEDICAID FL2 LEVEL OF CARE SCREENING TOOL     IDENTIFICATION  Patient Name: [name] [name] Birthdate: [birth year] Sex: male Admission Date (Current Location): 08/25/2015  [facility] and [place] Number:     Facility and Address:  [facility], [address], [place], [place] [address]      Provider Number: [id number]  Attending Physician Name and Address:  Leatha Gilding, MD  Relative Name and Phone Number:       Current Level of Care: Hospital Recommended Level of Care: Skilled Nursing Facility Prior Approval Number:    Date Approved/Denied:   PASRR Number: [id number] A [facility] [phone])  Discharge Plan: SNF    Current Diagnoses: Patient Active Problem List   Diagnosis Date Noted  . Goals of care, counseling/discussion   . Failure to thrive in adult   . DNR (do not resuscitate) discussion 08/26/2015  . Palliative care encounter 08/26/2015  . Stroke-like symptoms   . Seizure (HCC) 08/25/2015  . SVT (supraventricular tachycardia) (HCC) 08/25/2015  . Essential hypertension 08/25/2015  . Facial droop 08/25/2015  . Hyperglycemia 08/25/2015  . Major depression, chronic (HCC) 06/29/2015  . Hip fracture (HCC) 11/29/2013  . Closed left hip fracture (HCC) 11/28/2013  . Alzheimer's dementia 11/28/2013  . Depression 11/28/2013  . Anxiety 11/28/2013  . HLD (hyperlipidemia) 11/28/2013  . BPH (benign prostatic hyperplasia) 11/28/2013  . Abnormal EKG 11/28/2013    Orientation ACTIVITIES/SOCIAL BLADDER RESPIRATION     (Disoriented )  Family supportive Incontinent O2 (As needed)  BEHAVIORAL SYMPTOMS/MOOD NEUROLOGICAL BOWEL NUTRITION STATUS      Incontinent Diet (DYS 1)  PHYSICIAN VISITS COMMUNICATION OF NEEDS Height & Weight Skin    Non-Verbally  (177.8 cm) 175 lbs. Normal          AMBULATORY STATUS RESPIRATION    Assist extensive O2 (As needed)      Personal Care Assistance Level of Assistance  Bathing, Dressing Bathing  Assistance: Limited assistance   Dressing Assistance: Limited assistance      Functional Limitations Info                SPECIAL CARE FACTORS FREQUENCY  PT (By licensed PT), OT (By licensed OT)                   Additional Factors Info  Code Status, Allergies Code Status Info: DNR  Allergies Info: NKA           Current Medications (08/31/2015): Current Facility-Administered Medications  Medication Dose Route Frequency Provider Last Rate Last Dose  . 0.9 %  sodium chloride infusion   Intravenous Continuous Vassie Loll, MD 75 mL/hr at 08/31/15 0511    . acetaminophen (TYLENOL) tablet 650 mg  650 mg Oral Q4H PRN Vassie Loll, MD       Or  . acetaminophen (TYLENOL) suppository 650 mg  650 mg Rectal Q4H PRN Vassie Loll, MD   650 mg at 08/27/15 0445  . antiseptic oral rinse (CPC / CETYLPYRIDINIUM CHLORIDE 0.05%) solution 7 mL  7 mL Mouth Rinse BID Vassie Loll, MD   7 mL at 08/31/15 1054  . aspirin suppository 300 mg  300 mg Rectal Daily Vassie Loll, MD   300 mg at 08/29/15 1049   Or  . aspirin tablet 325 mg  325 mg Oral Daily Vassie Loll, MD   325 mg at 08/31/15 1046  . diltiazem (CARDIZEM) tablet 15 mg  15 mg Oral 3 times per day Costin Otelia Sergeant, MD  15 mg at 08/31/15 0556  . divalproex (DEPAKOTE SPRINKLE) capsule 750 mg  750 mg Oral Q12H Canary BrimMary W Larach, NP   750 mg at 08/31/15 1046  . heparin injection 5,000 Units  5,000 Units Subcutaneous 3 times per day Vassie Lollarlos Madera, MD   5,000 Units at 08/31/15 0555   Do not use this list as official medication orders. Please verify with discharge summary.  Discharge Medications:   Medication List    STOP taking these medications        amLODipine 5 MG tablet  Commonly known as:  NORVASC      TAKE these medications        acetaminophen 650 MG CR tablet  Commonly known as:  TYLENOL  Take 650 mg by mouth every 4 (four) hours as needed for pain.     ALPRAZolam 0.25 MG tablet  Commonly known as:  XANAX  Take  0.5 tablets (0.125 mg total) by mouth 2 (two) times daily. For mood disorder     ALPRAZolam 0.5 MG tablet  Commonly known as:  XANAX  Take 1 tablet (0.5 mg total) by mouth every 8 (eight) hours as needed for anxiety (For severe anxiety only).     aspirin EC 325 MG tablet  Take 1 tablet (325 mg total) by mouth daily.     cholecalciferol 1000 UNITS tablet  Commonly known as:  VITAMIN D  Take 1,000 Units by mouth daily.     citalopram 10 MG tablet  Commonly known as:  CELEXA  Take 10 mg by mouth daily. For depression     diltiazem 30 MG tablet  Commonly known as:  CARDIZEM  Take 0.5 tablets (15 mg total) by mouth every 8 (eight) hours.     divalproex 125 MG capsule  Commonly known as:  DEPAKOTE SPRINKLE  Take 6 capsules (750 mg total) by mouth every 12 (twelve) hours.     doxazosin 4 MG tablet  Commonly known as:  CARDURA  Take 4 mg by mouth at bedtime.     finasteride 5 MG tablet  Commonly known as:  PROSCAR  Take 5 mg by mouth daily.     furosemide 20 MG tablet  Commonly known as:  LASIX  Take 20 mg by mouth daily as needed for edema.     QUEtiapine 25 MG tablet  Commonly known as:  SEROQUEL  Take 75 mg by mouth at bedtime.        Relevant Imaging Results:  Relevant Lab Results:  Recent Labs    Additional Information    [name] Flow, LCSW

## 2015-08-31 NOTE — Clinical Social Work Note (Signed)
CSW spoke with the pt's wife Lupita LeashDonna. CSW informed Lupita LeashDonna that the pt will be discharge back to [facility] today. CSW and Lupita LeashDonna discussed ambulance transport. CSW contact [facility] at [phone] to schedule transport for the pt. CSW informed Jasmine DecemberSharon at [facility] regarding the pt return. CSW upload the pt's discharge summary. Bedside RN provided phone number to call report.   Kaysen Deal, MSW, LCSWA [phone]

## 2015-08-31 NOTE — Progress Notes (Signed)
Pt discharged off unit per MD order. Pt discharged via transport service to skilled nursing facility. Pt was unable to verbalize understanding but pt's wife did verbalize understanding about situation. All questions and concerns were addressed. Pt's IV's were removed before discharge.

## 2015-08-31 NOTE — Clinical Social Work Note (Signed)
Clinical Social Work Assessment  Patient Details  Name: [name] [name] MRN: [id number] Date of Birth: 1935/03/02  Date of referral:  08/31/15               Reason for consult:  Facility Placement                Permission sought to share information with:  Family Supports Permission granted to share information::     Name::     [name] [name]  Relationship::  wife   Housing/Transportation Living arrangements for the past 2 months:  [facility] Source of Information:  Spouse Patient Interpreter Needed:  None Criminal Activity/Legal Involvement Pertinent to Current Situation/Hospitalization:  No - Comment as needed Significant Relationships:  Adult Children, Spouse Lives with:  Facility Resident Do you feel safe going back to the place where you live?  Yes Need for family participation in patient care:  Yes (Comment)  Care giving concerns:  None    Social Worker assessment / plan:  CSW spoke with the pt's wife Lupita LeashDonna. CSW introduced self and purpose of the call. CSW discussed discharge plan with the Lupita LeashDonna. Lupita LeashDonna confirmed that the pt is a long term resident at [facility]. Lupita LeashDonna reported that she would like the pt to return. CSW answered all questions in which Lupita LeashDonna inquired about. CSW will continue to follow this pt and assist with discharge as needed.   Employment status:  Retired Health and safety inspectornsurance information:  Medicare PT Recommendations:  Skilled Nursing Facility Information / Referral to community resources:  Skilled Nursing Facility  Patient/Family's Response to care:  Lupita LeashDonna reported that the staff has cared for the pt well.   Patient/Family's Understanding of and Emotional Response to Diagnosis, Current Treatment, and Prognosis: Lupita LeashDonna acknowledged that the pt will return back [facility] with hospice. Lupita LeashDonna shared that she has come to terms with the decline in the pt's health and would like the pt to be in a familiar enviorment.   Emotional Assessment Appearance:    (Unable to Assess ) Attitude/Demeanor/Rapport:  Unable to Assess Affect (typically observed):  Unable to Assess Orientation:   (Disoriented ) Alcohol / Substance use:  Not Applicable Psych involvement (Current and /or in the community):  No (Comment)  Discharge Needs  Concerns to be addressed:  Denies Needs/Concerns at this time Readmission within the last 30 days:  No Current discharge risk:  None Barriers to Discharge:  No Barriers Identified   Toshia Larkin, LCSW 08/31/2015, 11:45AM

## 2015-09-01 ENCOUNTER — Non-Acute Institutional Stay (SKILLED_NURSING_FACILITY): Payer: Medicare Other | Admitting: Adult Health

## 2015-09-01 ENCOUNTER — Encounter: Payer: Self-pay | Admitting: Adult Health

## 2015-09-01 DIAGNOSIS — N400 Enlarged prostate without lower urinary tract symptoms: Secondary | ICD-10-CM | POA: Diagnosis not present

## 2015-09-01 DIAGNOSIS — F419 Anxiety disorder, unspecified: Secondary | ICD-10-CM

## 2015-09-01 DIAGNOSIS — R609 Edema, unspecified: Secondary | ICD-10-CM | POA: Diagnosis not present

## 2015-09-01 DIAGNOSIS — G309 Alzheimer's disease, unspecified: Secondary | ICD-10-CM | POA: Diagnosis not present

## 2015-09-01 DIAGNOSIS — F0280 Dementia in other diseases classified elsewhere without behavioral disturbance: Secondary | ICD-10-CM

## 2015-09-01 DIAGNOSIS — F329 Major depressive disorder, single episode, unspecified: Secondary | ICD-10-CM | POA: Diagnosis not present

## 2015-09-01 DIAGNOSIS — I471 Supraventricular tachycardia: Secondary | ICD-10-CM | POA: Diagnosis not present

## 2015-09-01 DIAGNOSIS — R569 Unspecified convulsions: Secondary | ICD-10-CM | POA: Diagnosis not present

## 2015-09-01 DIAGNOSIS — F32A Depression, unspecified: Secondary | ICD-10-CM

## 2015-09-01 NOTE — Progress Notes (Signed)
Patient ID: [name] [name], male   DOB: 1935/01/13, 80 y.o.   MRN: [id number]    DATE:  09/01/2015   MRN:  [id number]  BIRTHDAY: Dec 09, 1934  Facility:  Nursing Home Location:  [facility]  Nursing Home Room Number: 403-1  LEVEL OF CARE:  SNF [phone])  Contact Information    Name Relation Home Work Mobile   Kinkade,Donna Spouse   [phone]   Barbette Reichmann Daughter [phone]         Chief Complaint  Patient presents with  . Hospitalization Follow-up    Seizure, Alzheimer's dementia, BPH, SVT, anxiety, depression and edema    HISTORY OF PRESENT ILLNESS:  This is a 80 year old male who has been admitted to [facility] on 08/31/15 from [facility]. He is a long-term resident of [facility] was transferred to the hospital due to seizures. Neurology was consulted and was restarted on Depakote. EEG with generalized slowing C/W Alzheimer's, as well as findings of an epileptic disorder with eliptogenic foci involving right and left frontal regions. MRI without acute findings.   PAST MEDICAL HISTORY:  Past Medical History  Diagnosis Date  . Dementia   . Hypertension   . Alzheimer disease      CURRENT MEDICATIONS: Reviewed  Patient's Medications  New Prescriptions   No medications on file  Previous Medications   ACETAMINOPHEN (TYLENOL) 650 MG CR TABLET    Take 650 mg by mouth every 4 (four) hours as needed for pain.   ALPRAZOLAM (XANAX) 0.25 MG TABLET    Take 0.5 tablets (0.125 mg total) by mouth 2 (two) times daily. For mood disorder   ALPRAZOLAM (XANAX) 0.5 MG TABLET    Take 1 tablet (0.5 mg total) by mouth every 8 (eight) hours as needed for anxiety (For severe anxiety only).   ASPIRIN EC 325 MG TABLET    Take 1 tablet (325 mg total) by mouth daily.   CHOLECALCIFEROL (VITAMIN D) 1000 UNITS TABLET    Take 1,000 Units by mouth daily.   CITALOPRAM (CELEXA) 10 MG TABLET    Take 10 mg by mouth daily. For depression   DILTIAZEM (CARDIZEM) 30 MG  TABLET    Take 0.5 tablets (15 mg total) by mouth every 8 (eight) hours.   DIVALPROEX (DEPAKOTE SPRINKLE) 125 MG CAPSULE    Take 6 capsules (750 mg total) by mouth every 12 (twelve) hours.   DOXAZOSIN (CARDURA) 4 MG TABLET    Take 4 mg by mouth at bedtime.   FINASTERIDE (PROSCAR) 5 MG TABLET    Take 5 mg by mouth daily.   FUROSEMIDE (LASIX) 20 MG TABLET    Take 20 mg by mouth daily as needed for edema.   QUETIAPINE (SEROQUEL) 25 MG TABLET    Take 75 mg by mouth at bedtime.   Modified Medications   No medications on file  Discontinued Medications   No medications on file     No Known Allergies   REVIEW OF SYSTEMS: Unable to obtain due to advanced dementia   PHYSICAL EXAMINATION  GENERAL APPEARANCE: Well nourished. In no acute distress. Normal body habitus HEAD: Normal in size and contour. No evidence of trauma EYES: Lids open and close normally. No blepharitis, entropion or ectropion. PERRL. Conjunctivae are clear and sclerae are white. Lenses are without opacity EARS: Pinnae are normal. Patient hears normal voice tunes of the examiner MOUTH and THROAT: Lips are without lesions. Oral mucosa is moist and without lesions. Tongue is normal in shape, size, and  color and without lesions NECK: supple, trachea midline, no neck masses, no thyroid tenderness, no thyromegaly LYMPHATICS: no LAN in the neck, no supraclavicular LAN RESPIRATORY: breathing is even & unlabored, BS CTAB CARDIAC: RRR, no murmur,no extra heart sounds, no edema GI: abdomen soft, normal BS, no masses, no tenderness, no hepatomegaly, no splenomegaly EXTREMITIES:  Did not move with verbal stimuli PSYCHIATRIC: Asleep.   LABS/RADIOLOGY: Labs reviewed: Basic Metabolic Panel:  Recent Labs  16/07/9610/02/16 0911 08/25/15 1940 08/26/15 0445 08/27/15 0506  NA 137  --  142 145  K 3.7  --  3.9 3.8  CL 104  --  113* 111  CO2 22  --  18* 24  GLUCOSE 146*  --  109* 91  BUN 11  --  17 16  CREATININE 0.91 1.24 1.05 0.79    CALCIUM 9.1  --  8.2* 8.4*  MG  --   --   --  2.1   Liver Function Tests:  Recent Labs  08/26/15 0445  AST 32  ALT 16*  ALKPHOS 71  BILITOT 1.1  PROT 5.2*  ALBUMIN 2.5*    Recent Labs  08/27/15 1037  AMMONIA 26   CBC:  Recent Labs  08/25/15 1940 08/26/15 0445 08/27/15 0506  WBC 19.6* 14.8* 9.1  HGB 16.6 14.5 12.5*  HCT 49.3 42.7 37.9*  MCV 93.7 93.8 95.5  PLT 178 159 124*   Lipid Panel:  Recent Labs  08/26/15 0445  HDL 33*   CBG:  Recent Labs  08/27/15 1953 08/28/15 0807 08/28/15 1225  GLUCAP 85 85 77    Ct Head Wo Contrast  08/25/2015  CLINICAL DATA:  Seizure, dementia EXAM: CT HEAD WITHOUT CONTRAST TECHNIQUE: Contiguous axial images were obtained from the base of the skull through the vertex without intravenous contrast. COMPARISON:  11/28/2013 FINDINGS: No skull fracture is noted. No intracranial hemorrhage, mass effect or midline shift. Paranasal sinuses and mastoid air cells are unremarkable. Extensive cerebral atrophy again noted. Extensive periventricular and subcortical chronic white matter disease. Ventriculomegaly is stable from prior exam. No acute cortical infarction. No mass lesion is noted on this unenhanced scan. IMPRESSION: No acute intracranial abnormality. Stable atrophy and extensive chronic white matter disease. No definite acute cortical infarction. Electronically Signed   By: Natasha MeadLiviu  Pop M.D.   On: 08/25/2015 10:17   [name]  08/26/2015  CLINICAL DATA:  Patient with severe dementia who presents with seizure activity. Patient recently withdrawn from Depakote which was given for mood disorder. Also LEFT facial droop and inability to follow commands. EXAM: MRI HEAD WITHOUT CONTRAST TECHNIQUE: Multiplanar, multiecho pulse sequences of the brain and surrounding structures were obtained without intravenous contrast. COMPARISON:  CT head 08/25/2015. FINDINGS: The patient was unable to remain motionless for the exam. Small or subtle  lesions could be overlooked. The study was prematurely truncated due to lack of patient cooperation. The images which are obtained are overall diagnostic. Global atrophy. Hydrocephalus ex vacuo. Extreme white matter disease and extensive cortical atrophy. No restricted diffusion. No visible acute or chronic hemorrhage, mass lesion, or extra-axial fluid. Flow voids are maintained. BILATERAL cataract extraction. No acute sinus disease. RIGHT mastoid fluid. Compared with recent CT, the appearance is similar. IMPRESSION: Motion degraded and prematurely truncated exam. No acute intracranial findings. No features to suggest acute intracranial process. No restricted diffusion to suggest recent ictal phenomenon. Electronically Signed   By: Elsie StainJohn T Curnes M.D.   On: 08/26/2015 08:56   Dg Chest Portable 1 View  08/25/2015  CLINICAL DATA:  Decreased oxygen saturation. EXAM: PORTABLE CHEST 1 VIEW COMPARISON:  November 28, 2013 FINDINGS: There is mild interstitial edema. Heart is upper normal in size with pulmonary vascularity within normal limits. No airspace consolidation. No adenopathy. There is atherosclerotic calcification in the aorta. There is degenerative change in the thoracic spine. IMPRESSION: Mild interstitial edema. Question a degree of congestive heart failure. No airspace consolidation. Heart size within normal limits. Electronically Signed   By: Bretta Bang III M.D.   On: 08/25/2015 11:22    ASSESSMENT/PLAN:  Seizure - continue Depakote 125 mg 6 capsules = 750 mg by mouth every 12 hours;  follow-up with [facility].for hospice care  Alzheimer's dementia - advanced; continue supportive  BPH - continue Cardura 4 mg 1 tab by mouth daily at bedtime and Proscar 5 mg 1 tab by mouth daily  SVT - continue Cardizem 30 mg 1/2 tab = 15 mg by mouth every 8 hours  Anxiety - discontinue Xanax 0.25 mg 1/2 tab = 0.25 mg by mouth twice a day but continue Xanax 0.5 mg 1 tab by mouth every 8 hours when  necessary  Depression - patient is asleep and would not respond to verbal stimuli; discontinue Celexa and Seroquel  Edema - continue Lasix 20 mg 1 tab by mouth daily      Goals of care - multiple discussions throughout his hospitalization, DNR/DNI, no artificial feeding / feeding tubes, allow diet for comfort, medications as tolerated. Long-term care    [facility], NP [facility] [phone]

## 2015-09-01 NOTE — Progress Notes (Signed)
Patient ID: Hamzeh Tall, male   DOB: 09-24-1935, 80 y.o.   MRN: [id number]    DATE:  07/28/15  MRN:  [id number]  BIRTHDAY: [birth year]  Facility:  Nursing Home Location:  [facility]  Nursing Home Room Number: 403-1  LEVEL OF CARE:  SNF [phone])  Contact Information    Name Relation Home Work Mobile   Grillo,Donna Spouse   [phone]   Barbette Reichmann Daughter [phone]         Chief Complaint  Patient presents with  . Medical Management of Chronic Issues    Dementia, anxiety, depression, BPH, Candida skin and edema    HISTORY OF PRESENT ILLNESS:  This is a 80 year old male who is being seen for a routine visit. He is a long-term resident of [address] He has advanced dementia and needing total care daily. Ativan gel PRN was recently discontinued due to nonuse. Wife visits daily  PAST MEDICAL HISTORY:  Past Medical History  Diagnosis Date  . Dementia   . Hypertension   . Alzheimer disease      CURRENT MEDICATIONS: Reviewed   Patient's Medications                  ACETAMINOPHEN (TYLENOL) 650 MG CR TABLET    Take 650 mg by mouth every 4 (four) hours as needed for pain.       CHOLECALCIFEROL (VITAMIN D) 1000 UNITS TABLET    Take 1,000 Units by mouth daily.   CITALOPRAM (CELEXA) 10 MG TABLET    Take 10 mg by mouth daily. For depression   DOXAZOSIN (CARDURA) 4 MG TABLET    Take 4 mg by mouth at bedtime.   FINASTERIDE (PROSCAR) 5 MG TABLET    Take 5 mg by mouth daily.   FUROSEMIDE (LASIX) 20 MG TABLET    Take 20 mg by mouth daily as needed for edema.   QUETIAPINE (SEROQUEL) 25 MG TABLET    Take 75 mg by mouth at bedtime.          ALPRAZOLAM (XANAX) 0.25 MG TABLET       Take 1 tablet by mouth twice a day. For mood disorder    ALPRAZOLAM (XANAX) 0.5 MG TABLET ALPRAZolam (XANAX) 0.5 MG tablet      Take 1 tablet (0.5 mg total) by mouth every 8 (eight) hours as needed for anxiety (For severe anxiety only).    Take 1 tablet (0.5 mg total) by  mouth every 8 (eight) hours as needed for anxiety (For severe anxiety only).         DIVALPROEX (DEPAKOTE SPRINKLE) 125 MG CAPSULE    Take 125 mg by mouth daily. For psychosis       METRONIDAZOLE (METROGEL) 0.75 % GEL    Apply 1 application topically 2 (two) times daily. Apply to rash on face            No Known Allergies   REVIEW OF SYSTEMS:  Unable to obtain due to advanced dementia   PHYSICAL EXAMINATION  GENERAL APPEARANCE: Well nourished. In no acute distress. Normal body habitus SKIN:  Erythematous rashes on bilateral groin HEAD: Normal in size and contour. No evidence of trauma EYES: Lids open and close normally. No blepharitis, entropion or ectropion. PERRL. Conjunctivae are clear and sclerae are white. Lenses are without opacity EARS: Pinnae are normal. Patient hears normal voice tunes of the examiner MOUTH and THROAT: Lips are without lesions. Oral mucosa is moist and without lesions. Tongue is normal in shape,  size, and color and without lesions NECK: supple, trachea midline, no neck masses, no thyroid tenderness, no thyromegaly LYMPHATICS: no LAN in the neck, no supraclavicular LAN RESPIRATORY: breathing is even & unlabored, BS CTAB CARDIAC: RRR, no murmur,no extra heart sounds, no edema GI: abdomen soft, normal BS, no masses, no tenderness, no hepatomegaly, no splenomegaly EXTREMITIES:  Has generalized weakness PSYCHIATRIC: Non-verbal. Affect and behavior are appropriate  LABS/RADIOLOGY: Labs reviewed: 06/25/15 WBC 11.5 hemoglobin 15.6 hematocrit 48.5 MCV 94.5 platelet 235 sodium 142 potassium 4.2 glucose 90 BUN 17 creatinine 0.98 calcium 9.1 protein total 6.7 albumin 3.84 total bilirubin 0.60 98 AST 13 ALT 11 pre-albumin 21.8 TSH 2.927 vitamin D 19.27   Basic Metabolic Panel:  Recent Labs  16/10/96  NA 137  CREATININE 0.7   CBC:  Recent Labs  03/16/15  WBC 4.2  HGB 13.1*  PLT 189   Radiological Exams: Dg Chest 2 View  11/28/2013   CLINICAL DATA:  Larey Seat.   History of hypertension.  EXAM: CHEST  2 VIEW  COMPARISON:  None.  FINDINGS: Poor inspiration. Mildly enlarged cardiac silhouette. Clear lungs. Thoracic spine degenerative changes. No fracture or pneumothorax seen.  IMPRESSION: No acute abnormality.   Electronically Signed   By: Gordan Payment M.D.   On: 11/28/2013 19:39   Dg Forearm Left  11/28/2013   CLINICAL DATA:  Left forearm pain following injury  EXAM: LEFT FOREARM - 2 VIEW  COMPARISON:  None.  FINDINGS: There is no evidence of fracture or other focal bone lesions. Soft tissues are unremarkable.  IMPRESSION: No acute abnormality noted.   Electronically Signed   By: Alcide Clever M.D.   On: 11/28/2013 19:39   Dg Hip Complete Left  11/28/2013   CLINICAL DATA:  Left hip pain following a fall.  EXAM: LEFT HIP - COMPLETE 2+ VIEW  COMPARISON:  None.  FINDINGS: Comminuted left intertrochanteric fracture with varus angulation. There is also posterior angulation and 1/2 shaft width of anterior displacement of the distal fragment.  IMPRESSION: Comminuted left intertrochanteric fracture, as described above.   Electronically Signed   By: Gordan Payment M.D.   On: 11/28/2013 19:40   Dg Femur Left  11/29/2013   CLINICAL DATA:  80 year old male undergoing left proximal femur repair. Initial encounter.  EXAM: LEFT FEMUR - 2 VIEW; DG C-ARM 1-60 MIN - NRPT [facility]  COMPARISON:  Preoperative study 11/28/2013.  FLUOROSCOPY TIME:  1 minute and 4 seconds.  FINDINGS: Five intraoperative fluoroscopic views demonstrate left femur intra medullary rod placement with proximal interlocking dynamic hip screw. Near anatomic alignment about the comminuted intertrochanteric fracture.  IMPRESSION: Left proximal femur ORIF with no adverse features.   Electronically Signed   By: Augusto Gamble M.D.   On: 11/29/2013 03:36   Ct Head Wo Contrast  11/28/2013   CLINICAL DATA:  Pain post trauma  EXAM: CT HEAD WITHOUT CONTRAST  CT CERVICAL SPINE WITHOUT CONTRAST  TECHNIQUE: Multidetector CT imaging of the  head and cervical spine was performed following the standard protocol without intravenous contrast. Multiplanar CT image reconstructions of the cervical spine were also generated.  COMPARISON:  None.  FINDINGS: CT HEAD FINDINGS  There is moderately severe diffuse atrophy. There is no mass, hemorrhage, extra-axial fluid collection, or midline shift. There is small vessel disease throughout the centra semiovale bilaterally. No acute appearing infarct is seen, however.  Bony calvarium appears intact. The mastoid air cells are clear. There is debris in both external auditory canals.  CT CERVICAL SPINE FINDINGS  There is no fracture. There is slight anterolisthesis of C3 on C4, felt to be due to spondylosis. There is no other appreciable spondylolisthesis. Prevertebral soft tissues and predental space regions are normal.  There is disc space narrowing, marked, at C4-5, C5-6, and C6-7. There is moderate disc space narrowing at C3-4 and C7-T1. There is slightly milder narrowing at C2-3. There is moderate facet hypertrophy at all levels bilaterally. There is no frank disc extrusion or stenosis appreciable.  There is a nodular lesion in the left lobe of the thyroid measuring 1.1 x 0.8 cm.  IMPRESSION: CT head: Extensive atrophy with small vessel disease throughout the centra semiovale bilaterally. There is no intracranial mass, hemorrhage, or acute appearing infarct. There is probable cerumen in both external auditory canals.  CT cervical spine: Multilevel osteoarthritic change. Slight spondylolisthesis is C3-4 is felt to be due to spondylosis. No fracture apparent.  There is a focal nodular lesion in the left lobe of the thyroid. Nonemergent thyroid ultrasound advised to further assess.   Electronically Signed   By: Bretta Bang M.D.   On: 11/28/2013 15:26   Ct Cervical Spine Wo Contrast  11/28/2013   CLINICAL DATA:  Pain post trauma  EXAM: CT HEAD WITHOUT CONTRAST  CT CERVICAL SPINE WITHOUT CONTRAST  TECHNIQUE:  Multidetector CT imaging of the head and cervical spine was performed following the standard protocol without intravenous contrast. Multiplanar CT image reconstructions of the cervical spine were also generated.  COMPARISON:  None.  FINDINGS: CT HEAD FINDINGS  There is moderately severe diffuse atrophy. There is no mass, hemorrhage, extra-axial fluid collection, or midline shift. There is small vessel disease throughout the centra semiovale bilaterally. No acute appearing infarct is seen, however.  Bony calvarium appears intact. The mastoid air cells are clear. There is debris in both external auditory canals.  CT CERVICAL SPINE FINDINGS  There is no fracture. There is slight anterolisthesis of C3 on C4, felt to be due to spondylosis. There is no other appreciable spondylolisthesis. Prevertebral soft tissues and predental space regions are normal.  There is disc space narrowing, marked, at C4-5, C5-6, and C6-7. There is moderate disc space narrowing at C3-4 and C7-T1. There is slightly milder narrowing at C2-3. There is moderate facet hypertrophy at all levels bilaterally. There is no frank disc extrusion or stenosis appreciable.  There is a nodular lesion in the left lobe of the thyroid measuring 1.1 x 0.8 cm.  IMPRESSION: CT head: Extensive atrophy with small vessel disease throughout the centra semiovale bilaterally. There is no intracranial mass, hemorrhage, or acute appearing infarct. There is probable cerumen in both external auditory canals.  CT cervical spine: Multilevel osteoarthritic change. Slight spondylolisthesis is C3-4 is felt to be due to spondylosis. No fracture apparent.  There is a focal nodular lesion in the left lobe of the thyroid. Nonemergent thyroid ultrasound advised to further assess.   Electronically Signed   By: Bretta Bang M.D.   On: 11/28/2013 15:26   Dg Pelvis Portable  11/29/2013   CLINICAL DATA:  80 year old male status post left femoral ORIF. Initial encounter.  EXAM:  PORTABLE PELVIS 1-2 VIEWS  COMPARISON:  0323 hr the same day and earlier.  FINDINGS: Portable AP view at 0416 hrs. Partially visible left femur intra medullary rod with proximal interlocking dynamic hip screw. The distal aspect of the IM rod is not included. Near anatomic alignment of the left intertrochanteric fracture. Overlying postoperative changes to the soft tissues. Visible pelvis appears stable.  IMPRESSION:  Left femur ORIF with no adverse features.   Electronically Signed   By: Augusto GambleLee  Hall M.D.   On: 11/29/2013 04:50    ASSESSMENT/PLAN:  Dementia with behavioral disturbance - continue Depakote 125 mg by mouth daily; continue supportive care; check CBC  Anxiety - continue Zanaflex 0.25 mg by mouth twice a day and Xanax 0.5 mg by mouth every 8 hours when necessary  Major depression - continue Celexa 10 mg by mouth daily  BPH - continue Cardura and Proscar  Candida skin infection - continue nystatin powder  Edema - continue Lasix when necessary    Goals of care:  Long-term care   [facility]lMEDINA-VARGAS,MONINA, NP [facility] [phone]

## 2015-09-23 DEATH — deceased

## 2017-05-26 IMAGING — CR DG CHEST 1V PORT
1 series · 1 of 1 positions shown · non-contrast
Comparison: November 28, 2013

CLINICAL DATA: Decreased oxygen saturation.

EXAM:
PORTABLE CHEST 1 VIEW

[AP]
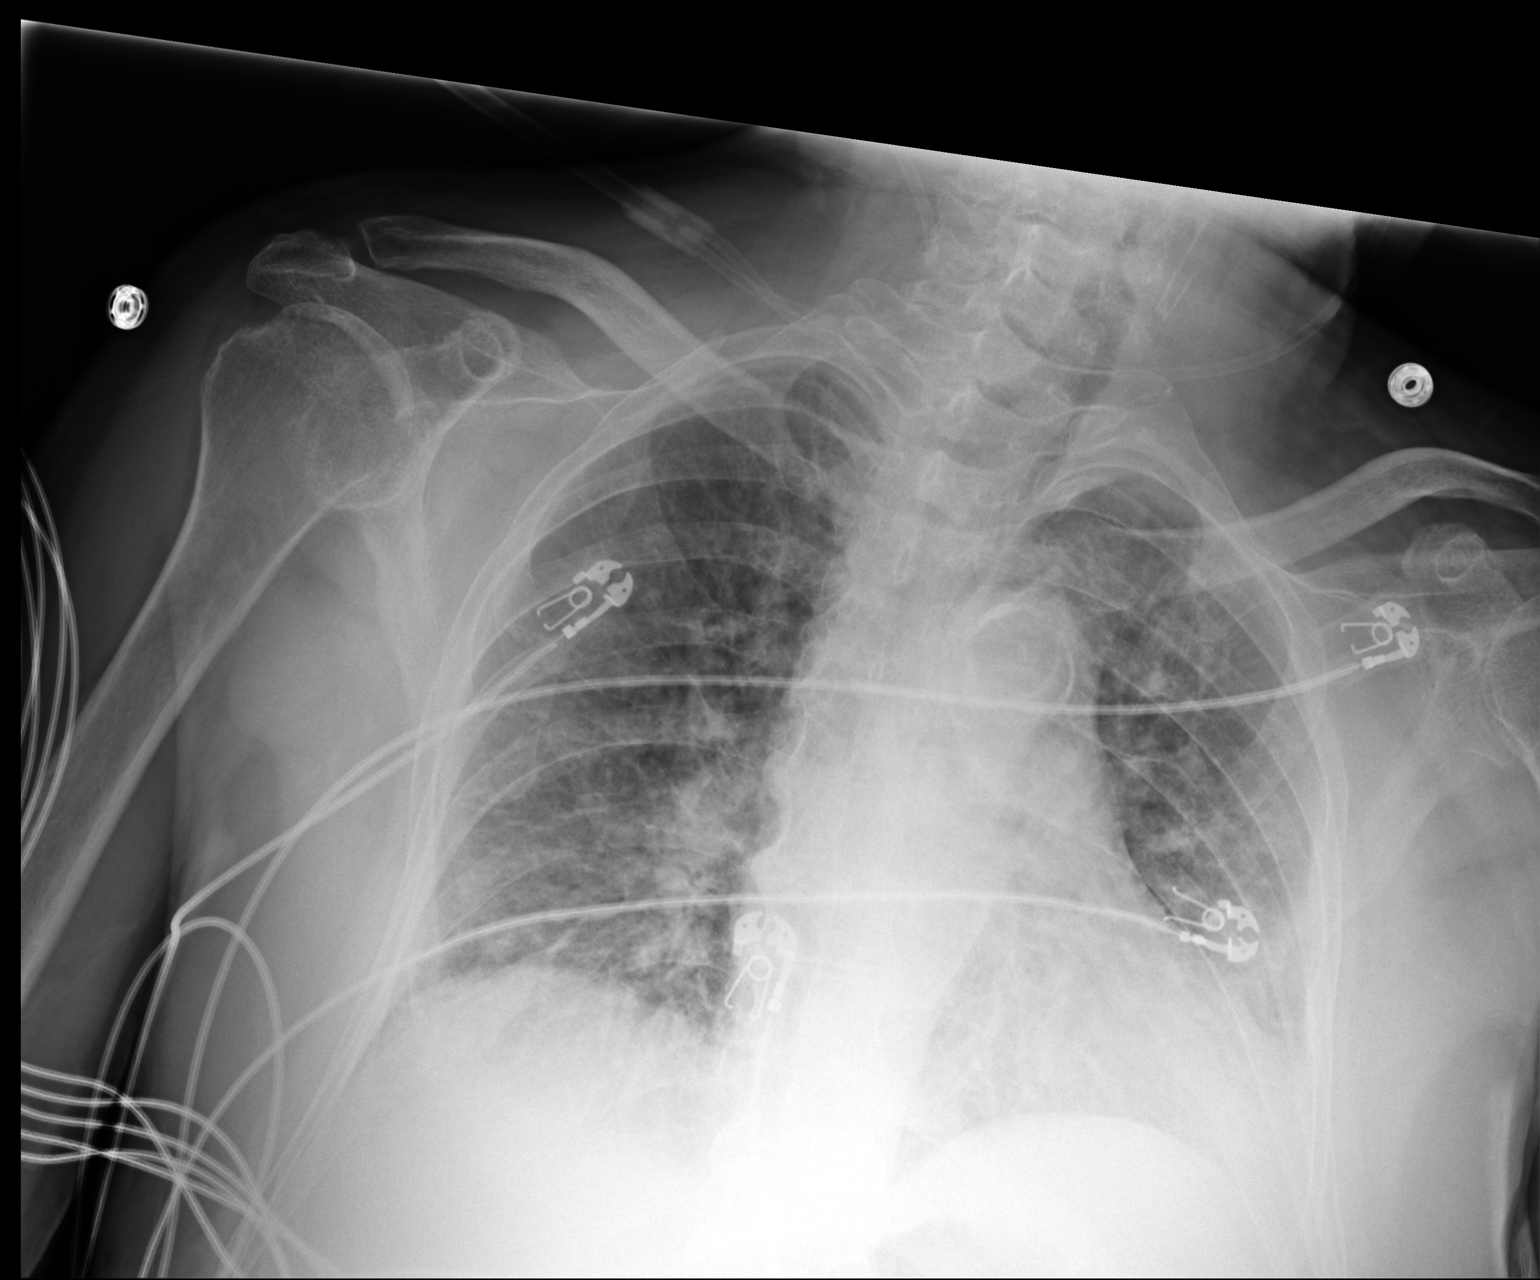

[1 of 1 positions shown; findings below may reference images not displayed]

FINDINGS: There is mild interstitial edema. Heart is upper normal in size with
pulmonary vascularity within normal limits. No airspace
consolidation. No adenopathy. There is atherosclerotic calcification
in the aorta. There is degenerative change in the thoracic spine.
IMPRESSION: Mild interstitial edema. Question a degree of congestive heart
failure. No airspace consolidation. Heart size within normal limits.
